# Patient Record
Sex: Male | Born: 1955 | Race: White | Hispanic: No | Marital: Married | Smoking: Former smoker
Health system: Southern US, Community
[De-identification: ages and names within clinical notes are randomized; demographics above are authoritative.]

## PROBLEM LIST (undated history)

## (undated) DIAGNOSIS — T7840XA Allergy, unspecified, initial encounter: Secondary | ICD-10-CM

## (undated) DIAGNOSIS — E785 Hyperlipidemia, unspecified: Secondary | ICD-10-CM

## (undated) DIAGNOSIS — H269 Unspecified cataract: Secondary | ICD-10-CM

## (undated) DIAGNOSIS — N200 Calculus of kidney: Secondary | ICD-10-CM

## (undated) HISTORY — DX: Allergy, unspecified, initial encounter: T78.40XA

## (undated) HISTORY — PX: NASAL SINUS SURGERY: SHX719

## (undated) HISTORY — DX: Hyperlipidemia, unspecified: E78.5

## (undated) HISTORY — PX: UPPER GASTROINTESTINAL ENDOSCOPY: SHX188

## (undated) HISTORY — PX: VASECTOMY: SHX75

## (undated) HISTORY — PX: OTHER SURGICAL HISTORY: SHX169

## (undated) HISTORY — DX: Unspecified cataract: H26.9

## (undated) HISTORY — PX: HYDROCELE EXCISION / REPAIR: SUR1145

## (undated) HISTORY — PX: CHOLECYSTECTOMY: SHX55

## (undated) HISTORY — DX: Calculus of kidney: N20.0

---

## 2007-10-27 ENCOUNTER — Ambulatory Visit: Payer: Self-pay | Admitting: Internal Medicine

## 2007-11-11 ENCOUNTER — Ambulatory Visit: Payer: Self-pay | Admitting: Internal Medicine

## 2011-08-11 NOTE — Telephone Encounter (Signed)
error 

## 2011-08-19 ENCOUNTER — Ambulatory Visit (AMBULATORY_SURGERY_CENTER): Payer: BC Managed Care – PPO

## 2011-08-19 VITALS — Ht 70.0 in | Wt 236.2 lb

## 2011-08-19 DIAGNOSIS — Z8371 Family history of colonic polyps: Secondary | ICD-10-CM

## 2011-08-19 DIAGNOSIS — Z1211 Encounter for screening for malignant neoplasm of colon: Secondary | ICD-10-CM

## 2011-08-19 DIAGNOSIS — K5900 Constipation, unspecified: Secondary | ICD-10-CM

## 2011-08-19 MED ORDER — PEG-KCL-NACL-NASULF-NA ASC-C 100 G PO SOLR: 1.0000 | Freq: Once | ORAL | Status: AC

## 2011-08-19 NOTE — Progress Notes (Signed)
Pt came into the office today for his pre-visit prior to the colonoscopy on 08/26/11 with Dr Marina Goodell. Pt states he had a colonoscopy done in 2005 with Dr. Laural Benes at [facility]. Medical release form was filled out and was given to [facility]. Ulis Rias RN

## 2011-08-26 ENCOUNTER — Ambulatory Visit (AMBULATORY_SURGERY_CENTER): Payer: BC Managed Care – PPO | Admitting: Internal Medicine

## 2011-08-26 ENCOUNTER — Encounter: Payer: Self-pay | Admitting: Internal Medicine

## 2011-08-26 VITALS — BP 151/92 | HR 72 | Temp 97.0°F | Resp 17 | Ht 70.0 in | Wt 236.0 lb

## 2011-08-26 DIAGNOSIS — Z8371 Family history of colonic polyps: Secondary | ICD-10-CM

## 2011-08-26 DIAGNOSIS — D126 Benign neoplasm of colon, unspecified: Secondary | ICD-10-CM

## 2011-08-26 DIAGNOSIS — Z1211 Encounter for screening for malignant neoplasm of colon: Secondary | ICD-10-CM

## 2011-08-26 MED ORDER — SODIUM CHLORIDE 0.9 % IV SOLN: 500.0000 mL | INTRAVENOUS | Status: DC

## 2011-08-26 NOTE — Patient Instructions (Signed)
Discharge instructions per blue and green sheets  Handouts on polyps, diverticulosis, high fiber diet, hemorrhoids  We will mail a letter in 1-2 weeks with the results and Dr Lamar Sprinkles recomendations

## 2011-08-26 NOTE — Op Note (Signed)
[facility] [address] [place], [place]  [address]  COLONOSCOPY PROCEDURE REPORT  PATIENT:  Jeffery Evans, Jeffery Evans  MR#:  [id number] BIRTHDATE:  [birth year], 55 yrs. old  GENDER:  male ENDOSCOPIST:  Wilhemina Bonito. Eda Keys, MD REF. BY:  Chilton Greathouse, M.D. PROCEDURE DATE:  08/26/2011 PROCEDURE:  Colonoscopy with snare polypectomy x 2 ASA CLASS:  Class II INDICATIONS:  Elevated Risk Screening ; index exam in [place] w/ two subcentimeter hyperplastic polyps; brother with advanced adenoma MEDICATIONS:   MAC sedation, administered by CRNA, propofol (Diprivan) 350 mg IV  DESCRIPTION OF PROCEDURE:   After the risks benefits and alternatives of the procedure were thoroughly explained, informed consent was obtained.  Digital rectal exam was performed and revealed no abnormalities.   The [id number] endoscope was introduced through the anus and advanced to the cecum, which was identified by both the appendix and ileocecal valve, without limitations.  The quality of the prep was excellent, using MoviPrep.  The instrument was then slowly withdrawn as the colon was fully examined. <<PROCEDUREIMAGES>>  FINDINGS:  Two polyps measuring 5mm and 12mm were found in the transverse colon. Polyps were snared (larger with cautery). Retrieval was successful. Scattered diverticula were found. Otherwise normal colonoscopy without other polyps, masses, vascular ectasias, or inflammatory changes.   Retroflexed views in the rectum revealed internal hemorrhoids.    The time to cecum = 2:29 minutes. The scope was then withdrawn in 20:28  minutes from the cecum and the procedure completed.  COMPLICATIONS:  None  ENDOSCOPIC IMPRESSION: 1) Two polyps in the transverse colon - removed 2) Diverticula, scattered 3) Otherwise normal colonoscopy 4) Internal hemorrhoids  RECOMMENDATIONS: 1) Follow up colonoscopy in 5 years  ______________________________ Wilhemina Bonito. Eda Keys, MD  CC:  Chilton Greathouse, MD;  The Patient  n. eSIGNED:   Wilhemina Bonito. Eda Keys at 08/26/2011 02:57 PM  Mal Amabile, [phone]

## 2011-08-26 NOTE — Progress Notes (Signed)
Patient did not experience any of the following events: a burn prior to discharge; a fall within the facility; wrong site/side/patient/procedure/implant event; or a hospital transfer or hospital admission upon discharge from the facility. (G8907) Patient did not have preoperative order for IV antibiotic SSI prophylaxis. (G8918)  

## 2011-08-27 ENCOUNTER — Telehealth: Payer: Self-pay

## 2011-08-27 NOTE — Telephone Encounter (Signed)
Left message on answering machine. 

## 2012-08-19 ENCOUNTER — Emergency Department (HOSPITAL_COMMUNITY)
Admission: EM | Admit: 2012-08-19 | Discharge: 2012-08-19 | Disposition: A | Discharge status: Home / Self Care | Payer: BC Managed Care – PPO | Attending: Emergency Medicine | Admitting: Emergency Medicine

## 2012-08-19 DIAGNOSIS — Z7982 Long term (current) use of aspirin: Secondary | ICD-10-CM | POA: Insufficient documentation

## 2012-08-19 DIAGNOSIS — Z87891 Personal history of nicotine dependence: Secondary | ICD-10-CM | POA: Insufficient documentation

## 2012-08-19 DIAGNOSIS — N200 Calculus of kidney: Secondary | ICD-10-CM | POA: Insufficient documentation

## 2012-08-19 DIAGNOSIS — Z791 Long term (current) use of non-steroidal anti-inflammatories (NSAID): Secondary | ICD-10-CM | POA: Insufficient documentation

## 2012-08-19 DIAGNOSIS — E785 Hyperlipidemia, unspecified: Secondary | ICD-10-CM | POA: Insufficient documentation

## 2012-08-19 DIAGNOSIS — Z87442 Personal history of urinary calculi: Secondary | ICD-10-CM | POA: Insufficient documentation

## 2012-08-19 DIAGNOSIS — R61 Generalized hyperhidrosis: Secondary | ICD-10-CM | POA: Insufficient documentation

## 2012-08-19 DIAGNOSIS — J309 Allergic rhinitis, unspecified: Secondary | ICD-10-CM | POA: Insufficient documentation

## 2012-08-19 DIAGNOSIS — Z79899 Other long term (current) drug therapy: Secondary | ICD-10-CM | POA: Insufficient documentation

## 2012-08-19 DIAGNOSIS — Z9889 Other specified postprocedural states: Secondary | ICD-10-CM | POA: Insufficient documentation

## 2012-08-19 DIAGNOSIS — R112 Nausea with vomiting, unspecified: Secondary | ICD-10-CM | POA: Insufficient documentation

## 2012-08-19 DIAGNOSIS — M549 Dorsalgia, unspecified: Secondary | ICD-10-CM | POA: Insufficient documentation

## 2012-08-19 LAB — URINALYSIS, ROUTINE W REFLEX MICROSCOPIC
Glucose, UA: NEGATIVE mg/dL
Ketones, ur: NEGATIVE mg/dL
Protein, ur: NEGATIVE mg/dL
pH: 5 (ref 5.0–8.0)

## 2012-08-19 LAB — URINE MICROSCOPIC-ADD ON

## 2012-08-19 MED ORDER — HYDROMORPHONE HCL PF 1 MG/ML IJ SOLN: 1.0000 mg | Freq: Once | INTRAMUSCULAR | Status: DC

## 2012-08-19 MED ORDER — HYDROMORPHONE HCL PF 1 MG/ML IJ SOLN
1.0000 mg | INTRAMUSCULAR | Status: DC | PRN
  Filled 2012-08-19: qty 1

## 2012-08-19 MED ORDER — KETOROLAC TROMETHAMINE 30 MG/ML IJ SOLN: 30.0000 mg | Freq: Once | INTRAMUSCULAR | Status: DC

## 2012-08-19 MED ORDER — NAPROXEN 500 MG PO TABS: 500.0000 mg | ORAL_TABLET | Freq: Two times a day (BID) | ORAL | Status: DC

## 2012-08-19 MED ORDER — ONDANSETRON 4 MG PO TBDP: 4.0000 mg | ORAL_TABLET | Freq: Three times a day (TID) | ORAL | Status: DC | PRN

## 2012-08-19 MED ORDER — HYDROCODONE-ACETAMINOPHEN 5-325 MG PO TABS: 2.0000 | ORAL_TABLET | ORAL | Status: DC | PRN

## 2012-08-19 MED ORDER — HYDROMORPHONE HCL PF 1 MG/ML IJ SOLN
INTRAMUSCULAR | Status: AC
  Administered 2012-08-19: 1 mg
  Filled 2012-08-19: qty 1

## 2012-08-19 MED ORDER — SODIUM CHLORIDE 0.9 % IV BOLUS (SEPSIS)
1000.0000 mL | Freq: Once | INTRAVENOUS | Status: AC
  Administered 2012-08-19: 1000 mL via INTRAVENOUS

## 2012-08-19 MED ORDER — HYDROMORPHONE HCL PF 1 MG/ML IJ SOLN
1.0000 mg | Freq: Once | INTRAMUSCULAR | Status: AC
  Administered 2012-08-19: 1 mg via INTRAVENOUS

## 2012-08-19 MED ORDER — ONDANSETRON HCL 4 MG/2ML IJ SOLN
4.0000 mg | Freq: Once | INTRAMUSCULAR | Status: AC
  Administered 2012-08-19: 4 mg via INTRAVENOUS
  Filled 2012-08-19: qty 2

## 2012-08-19 MED ORDER — TAMSULOSIN HCL 0.4 MG PO CAPS: 0.4000 mg | ORAL_CAPSULE | Freq: Two times a day (BID) | ORAL | Status: DC

## 2012-08-19 MED ORDER — KETOROLAC TROMETHAMINE 30 MG/ML IJ SOLN
INTRAMUSCULAR | Status: AC
  Administered 2012-08-19: 30 mg
  Filled 2012-08-19: qty 1

## 2012-08-19 NOTE — ED Provider Notes (Signed)
Pt not directly cared by me.  Please refer to Dr. Rondel Baton note.  Fayrene Helper, PA-C 08/19/12 [phone]

## 2012-08-19 NOTE — ED Notes (Signed)
Pt reports flank pain radating to groin

## 2012-08-19 NOTE — ED Provider Notes (Signed)
History     CSN: [id number]  Arrival date & time 08/19/12  0418   First MD Initiated Contact with Patient 08/19/12 [phone]      Chief Complaint  Patient presents with  . Flank Pain    (Consider location/radiation/quality/duration/timing/severity/associated sxs/prior treatment) HPI Comments: 56 year old male with a history of kidney stones presents with acute onset of left-sided abdominal pain radiating from the flank to the lower abdomen. He has associated nausea and diaphoresis. This is similar to his past kidney stones, comes in waves, severe at worst.  The history is provided by the patient.    Past Medical History  Diagnosis Date  . Hyperlipidemia   . Allergy   . Kidney stones     Past Surgical History  Procedure Date  . Nasal sinus surgery   . Cholecystectomy   . Upper gastrointestinal endoscopy   . Hydrocele excision / repair   . Chest tube placement     Family History  Problem Relation Age of Onset  . Colon polyps Mother   . Colon polyps Brother   . Heart disease Father     History  Substance Use Topics  . Smoking status: Former Smoker -- 7 years    Types: Cigarettes    Quit date: 08/19/1979  . Smokeless tobacco: Never Used  . Alcohol Use: No      Review of Systems  Constitutional: Negative for fever.  Gastrointestinal: Positive for nausea, vomiting and abdominal pain.  Musculoskeletal: Positive for back pain.    Allergies  Avelox  Home Medications   Current Outpatient Rx  Name  Route  Sig  Dispense  Refill  . ASPIRIN 325 MG PO TABS   Oral   Take 325 mg by mouth daily.           . FENOFIBRATE MICRONIZED 130 MG PO CAPS   Oral   Take 130 mg by mouth daily before breakfast.           . FEXOFENADINE-PSEUDOEPHED ER 180-240 MG PO TB24   Oral   Take 1 tablet by mouth daily.           Marland Kitchen ONE-DAILY MULTI VITAMINS PO TABS   Oral   Take 1 tablet by mouth daily.           Marland Kitchen OMEPRAZOLE 20 MG PO CPDR   Oral   Take 20 mg by mouth  daily.           Marland Kitchen HYDROCODONE-ACETAMINOPHEN 5-325 MG PO TABS   Oral   Take 2 tablets by mouth every 4 (four) hours as needed for pain.   30 tablet   0   . NAPROXEN 500 MG PO TABS   Oral   Take 1 tablet (500 mg total) by mouth 2 (two) times daily with a meal.   30 tablet   0   . ONDANSETRON 4 MG PO TBDP   Oral   Take 1 tablet (4 mg total) by mouth every 8 (eight) hours as needed for nausea.   10 tablet   0   . TAMSULOSIN HCL 0.4 MG PO CAPS   Oral   Take 1 capsule (0.4 mg total) by mouth 2 (two) times daily.   10 capsule   0     BP 143/80  Pulse 72  Temp 97.4 F (36.3 C) (Oral)  Resp 18  SpO2 97%  Physical Exam  Constitutional:       Uncomfortable appearing, diaphoretic  HENT:  Normocephalic, atraumatic, mucous membranes  Eyes:       No discharge, no jaundice  Abdominal: There is no tenderness.       Mild left CVA tenderness  Musculoskeletal: Normal range of motion. He exhibits no edema.  Neurological:       Alert, oriented, clear historian  Skin:       Diaphoretic, no rashes    ED Course  Procedures (including critical care time)  Labs Reviewed  URINALYSIS, ROUTINE W REFLEX MICROSCOPIC - Abnormal; Notable for the following:    Color, Urine AMBER (*)  BIOCHEMICALS MAY BE AFFECTED BY COLOR   APPearance CLOUDY (*)     Specific Gravity, Urine 1.033 (*)     Hgb urine dipstick LARGE (*)     Bilirubin Urine SMALL (*)     Leukocytes, UA TRACE (*)     All other components within normal limits  URINE MICROSCOPIC-ADD ON - Abnormal; Notable for the following:    Bacteria, UA FEW (*)     Casts HYALINE CASTS (*)     All other components within normal limits  URINE CULTURE   No results found.   1. Kidney stone on left side       MDM  The patient is ill-appearing with a likely kidney stone, pain medications ordered, fluids ordered, nausea medicines ordered.   Pain meds given with signficant improvement, VS stable, pt likely has left sided  ureteral colic - declines CT scan, meds for home, Urology f/u as needed.  Discharge Prescriptions include:  Naprosyn Hydrocodone Flomax Zofran       Vida Roller, MD 08/19/12 [phone]

## 2012-08-19 NOTE — ED Provider Notes (Signed)
  Medical screening examination/treatment/procedure(s) were conducted as a shared visit with non-physician practitioner(s) and myself.  I personally evaluated the patient during the encounter  Please see my separate respective documentation pertaining to this patient encounter   Vida Roller, MD 08/19/12 [phone]

## 2012-08-20 LAB — URINE CULTURE
Colony Count: NO GROWTH
Culture: NO GROWTH

## 2014-05-17 ENCOUNTER — Other Ambulatory Visit: Payer: Self-pay

## 2014-05-17 DIAGNOSIS — K625 Hemorrhage of anus and rectum: Secondary | ICD-10-CM

## 2014-05-18 ENCOUNTER — Other Ambulatory Visit: Payer: Self-pay

## 2014-05-18 MED ORDER — PEG-KCL-NACL-NASULF-NA ASC-C 100 G PO SOLR: 1.0000 | Freq: Once | ORAL | Status: DC

## 2014-05-24 ENCOUNTER — Encounter: Payer: BC Managed Care – PPO | Admitting: Internal Medicine

## 2014-06-06 ENCOUNTER — Encounter: Payer: BC Managed Care – PPO | Admitting: Internal Medicine

## 2014-06-06 ENCOUNTER — Ambulatory Visit (AMBULATORY_SURGERY_CENTER): Payer: BC Managed Care – PPO | Admitting: Internal Medicine

## 2014-06-06 ENCOUNTER — Encounter: Payer: Self-pay | Admitting: Internal Medicine

## 2014-06-06 VITALS — BP 123/78 | HR 57 | Temp 96.4°F | Resp 29 | Ht 70.0 in | Wt 236.0 lb

## 2014-06-06 DIAGNOSIS — Z86010 Personal history of colonic polyps: Secondary | ICD-10-CM

## 2014-06-06 MED ORDER — SODIUM CHLORIDE 0.9 % IV SOLN: 500.0000 mL | INTRAVENOUS | Status: DC

## 2014-06-06 NOTE — Patient Instructions (Signed)
YOU HAD AN ENDOSCOPIC PROCEDURE TODAY AT THE [facility]: Refer to the procedure report that was given to you for any specific questions about what was found during the examination.  If the procedure report does not answer your questions, please call your gastroenterologist to clarify.  If you requested that your care partner not be given the details of your procedure findings, then the procedure report has been included in a sealed envelope for you to review at your convenience later.  YOU SHOULD EXPECT: Some feelings of bloating in the abdomen. Passage of more gas than usual.  Walking can help get rid of the air that was put into your GI tract during the procedure and reduce the bloating. If you had a lower endoscopy (such as a colonoscopy or flexible sigmoidoscopy) you may notice spotting of blood in your stool or on the toilet paper. If you underwent a bowel prep for your procedure, then you may not have a normal bowel movement for a few days.  DIET: Your first meal following the procedure should be a light meal and then it is ok to progress to your normal diet.  A half-sandwich or bowl of soup is an example of a good first meal.  Heavy or fried foods are harder to digest and may make you feel nauseous or bloated.  Likewise meals heavy in dairy and vegetables can cause extra gas to form and this can also increase the bloating.  Drink plenty of fluids but you should avoid alcoholic beverages for 24 hours.  ACTIVITY: Your care partner should take you home directly after the procedure.  You should plan to take it easy, moving slowly for the rest of the day.  You can resume normal activity the day after the procedure however you should NOT DRIVE or use heavy machinery for 24 hours (because of the sedation medicines used during the test).    SYMPTOMS TO REPORT IMMEDIATELY: A gastroenterologist can be reached at any hour.  During normal business hours, 8:30 AM to 5:00 PM Monday through Friday,  call [phone].  After hours and on weekends, please call the GI answering service at [phone] who will take a message and have the physician on call contact you.   Following lower endoscopy (colonoscopy or flexible sigmoidoscopy):  Excessive amounts of blood in the stool  Significant tenderness or worsening of abdominal pains  Swelling of the abdomen that is new, acute  Fever of 100F or higher   FOLLOW UP: If any biopsies were taken you will be contacted by phone or by letter within the next 1-3 weeks.  Call your gastroenterologist if you have not heard about the biopsies in 3 weeks.  Our staff will call the home number listed on your records the next business day following your procedure to check on you and address any questions or concerns that you may have at that time regarding the information given to you following your procedure. This is a courtesy call and so if there is no answer at the home number and we have not heard from you through the emergency physician on call, we will assume that you have returned to your regular daily activities without incident.  SIGNATURES/CONFIDENTIALITY: You and/or your care partner have signed paperwork which will be entered into your electronic medical record.  These signatures attest to the fact that that the information above on your After Visit Summary has been reviewed and is understood.  Full responsibility of the confidentiality of   this discharge information lies with you and/or your care-partner.     Handouts were given to your care partner on  diverticulosis, a high fiber diet with liberal fluid intake and hemorrhoids. You may resume your current medications today. Please call if any questions or concerns.   

## 2014-06-06 NOTE — Progress Notes (Signed)
Report to PACU, RN, vss, BBS= Clear.  

## 2014-06-06 NOTE — Progress Notes (Signed)
No problems noted in the recovery room. maw 

## 2014-06-06 NOTE — Op Note (Signed)
[place]  [address] [place], [address]   COLONOSCOPY PROCEDURE REPORT  PATIENT: Jeffery Evans, Jeffery Evans  MR#: [id number] BIRTHDATE: 04-22-1956 , 56  yrs. old GENDER: male ENDOSCOPIST: Eustace Quail, MD REFERRED [id number] Program Recall PROCEDURE DATE:  06/06/2014 PROCEDURE:   Colonoscopy, surveillance First Screening Colonoscopy - Avg.  risk and is 50 yrs.  old or older - No.  Prior Negative Screening - Now for repeat screening. N/A  History of Adenoma - Now for follow-up colonoscopy & has been > or = to 3 yrs.  Yes hx of adenoma.  Has been 3 or more years since last colonoscopy.  Polyps Removed Today? No.  Recommend repeat exam, <10 yrs? Yes.  Polyps Removed Today? No.  Recommend repeat exam, <10 yrs? Yes.  High risk (family or personal hx). ASA CLASS:   Class I INDICATIONS:surveillance colonoscopy based on a history of adenomatous colonic polyp(s). Sibling with history of advanced adenoma. Index exam 2005 in [place] subcentimeter hyperplastic polyps. 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 MEDICATIONS: Monitored anesthesia care and Propofol 260 mg IV  DESCRIPTION OF PROCEDURE:   After the risks benefits and alternatives of the procedure were thoroughly explained, informed consent was obtained.  The digital rectal exam revealed no abnormalities of the rectum.   The LB [id number]  endoscope was introduced through the anus and advanced to the cecum, which was identified by both the appendix and ileocecal valve. No adverse events experienced.   The quality of the prep was excellent, using MoviPrep  The instrument was then slowly withdrawn as the colon was fully examined.  COLON FINDINGS: There was moderate diverticulosis noted in the sigmoid colon.   The colon mucosa was otherwise normal. No polyps identified.  Retroflexed views revealed internal hemorrhoids. The time to cecum=1 minutes 43 seconds.  Withdrawal time=11 minutes 13 seconds.   The scope was withdrawn and the procedure completed. COMPLICATIONS: There were no immediate complications.  ENDOSCOPIC IMPRESSION: 1.   Moderate diverticulosis was noted in the sigmoid colon 2.   The colon mucosa was otherwise normal 3.  Internal hemorrhoids  RECOMMENDATIONS: 1. Follow up colonoscopy in 5 years (advanced adenoma on previous exam)  eSigned:  Eustace Quail, MD 06/06/2014 1:24 PM   cc: Prince Solian, MD and The Patient

## 2014-06-07 ENCOUNTER — Telehealth: Payer: Self-pay

## 2014-06-07 NOTE — Telephone Encounter (Signed)
de Follow up Call-  Call back number 06/06/2014  Post procedure Call Back phone  # [phone]  Permission to leave phone message Yes     Patient questions:  Do you have a fever, pain , or abdominal swelling? No. Pain Score  0 *  Have you tolerated food without any problems? Yes.    Have you been able to return to your normal activities? Yes.    Do you have any questions about your discharge instructions: Diet   No. Medications  No. Follow up visit  No.  Do you have questions or concerns about your Care? No.  Actions: * If pain score is 4 or above: No action needed, pain <4.

## 2016-07-07 DIAGNOSIS — H6090 Unspecified otitis externa, unspecified ear: Secondary | ICD-10-CM | POA: Insufficient documentation

## 2016-07-11 ENCOUNTER — Encounter: Payer: Self-pay | Admitting: Internal Medicine

## 2018-03-03 DIAGNOSIS — L989 Disorder of the skin and subcutaneous tissue, unspecified: Secondary | ICD-10-CM | POA: Insufficient documentation

## 2019-05-16 ENCOUNTER — Encounter: Payer: Self-pay | Admitting: Internal Medicine

## 2019-06-08 ENCOUNTER — Encounter: Payer: Self-pay | Admitting: Internal Medicine

## 2019-06-13 ENCOUNTER — Ambulatory Visit (AMBULATORY_SURGERY_CENTER): Payer: Self-pay | Admitting: *Deleted

## 2019-06-13 ENCOUNTER — Other Ambulatory Visit: Payer: Self-pay

## 2019-06-13 VITALS — Temp 98.0°F | Ht 71.0 in | Wt 254.8 lb

## 2019-06-13 DIAGNOSIS — Z86010 Personal history of colonic polyps: Secondary | ICD-10-CM

## 2019-06-13 MED ORDER — NA SULFATE-K SULFATE-MG SULF 17.5-3.13-1.6 GM/177ML PO SOLN: 1.0000 | Freq: Once | ORAL | 0 refills | Status: AC

## 2019-06-13 NOTE — Progress Notes (Signed)

## 2019-07-01 ENCOUNTER — Encounter: Payer: Self-pay | Admitting: Internal Medicine

## 2019-07-15 ENCOUNTER — Ambulatory Visit (AMBULATORY_SURGERY_CENTER): Payer: BC Managed Care – PPO | Admitting: Internal Medicine

## 2019-07-15 ENCOUNTER — Other Ambulatory Visit: Payer: Self-pay

## 2019-07-15 ENCOUNTER — Encounter: Payer: Self-pay | Admitting: Internal Medicine

## 2019-07-15 VITALS — BP 134/72 | HR 68 | Temp 98.1°F | Resp 21 | Ht 71.0 in | Wt 254.0 lb

## 2019-07-15 DIAGNOSIS — Z86010 Personal history of colonic polyps: Secondary | ICD-10-CM

## 2019-07-15 DIAGNOSIS — D122 Benign neoplasm of ascending colon: Secondary | ICD-10-CM | POA: Diagnosis not present

## 2019-07-15 DIAGNOSIS — Z1211 Encounter for screening for malignant neoplasm of colon: Secondary | ICD-10-CM | POA: Diagnosis not present

## 2019-07-15 MED ORDER — SODIUM CHLORIDE 0.9 % IV SOLN: 500.0000 mL | INTRAVENOUS | Status: DC

## 2019-07-15 NOTE — Progress Notes (Signed)
Temp JB  V/S CW  I have reviewed the patient's medical history in detail and updated the computerized patient record. 

## 2019-07-15 NOTE — Progress Notes (Signed)
A/ox3, pleased with MAC, report to RN 

## 2019-07-15 NOTE — Progress Notes (Signed)
Called to room to assist during endoscopic procedure.  Patient ID and intended procedure confirmed with present staff. Received instructions for my participation in the procedure from the performing physician.  

## 2019-07-15 NOTE — Patient Instructions (Signed)
Handouts given: Polyps, Diverticulosis and Hemorrhoids   YOU HAD AN ENDOSCOPIC PROCEDURE TODAY AT THE [facility]:   Refer to the procedure report that was given to you for any specific questions about what was found during the examination.  If the procedure report does not answer your questions, please call your gastroenterologist to clarify.  If you requested that your care partner not be given the details of your procedure findings, then the procedure report has been included in a sealed envelope for you to review at your convenience later.  YOU SHOULD EXPECT: Some feelings of bloating in the abdomen. Passage of more gas than usual.  Walking can help get rid of the air that was put into your GI tract during the procedure and reduce the bloating. If you had a lower endoscopy (such as a colonoscopy or flexible sigmoidoscopy) you may notice spotting of blood in your stool or on the toilet paper. If you underwent a bowel prep for your procedure, you may not have a normal bowel movement for a few days.  Please Note:  You might notice some irritation and congestion in your nose or some drainage.  This is from the oxygen used during your procedure.  There is no need for concern and it should clear up in a day or so.  SYMPTOMS TO REPORT IMMEDIATELY:   Following lower endoscopy (colonoscopy or flexible sigmoidoscopy):  Excessive amounts of blood in the stool  Significant tenderness or worsening of abdominal pains  Swelling of the abdomen that is new, acute  Fever of 100F or higher   For urgent or emergent issues, a gastroenterologist can be reached at any hour by calling [phone].   DIET:  We do recommend a small meal at first, but then you may proceed to your regular diet.  Drink plenty of fluids but you should avoid alcoholic beverages for 24 hours.  ACTIVITY:  You should plan to take it easy for the rest of today and you should NOT DRIVE or use heavy machinery until tomorrow  (because of the sedation medicines used during the test).    FOLLOW UP: Our staff will call the number listed on your records 48-72 hours following your procedure to check on you and address any questions or concerns that you may have regarding the information given to you following your procedure. If we do not reach you, we will leave a message.  We will attempt to reach you two times.  During this call, we will ask if you have developed any symptoms of COVID 19. If you develop any symptoms (ie: fever, flu-like symptoms, shortness of breath, cough etc.) before then, please call [phone].  If you test positive for Covid 19 in the 2 weeks post procedure, please call and report this information to us.    If any biopsies were taken you will be contacted by phone or by letter within the next 1-3 weeks.  Please call us at [phone] if you have not heard about the biopsies in 3 weeks.    SIGNATURES/CONFIDENTIALITY: You and/or your care partner have signed paperwork which will be entered into your electronic medical record.  These signatures attest to the fact that that the information above on your After Visit Summary has been reviewed and is understood.  Full responsibility of the confidentiality of this discharge information lies with you and/or your care-partner. 

## 2019-07-15 NOTE — Op Note (Signed)
[place] Patient Name: Jeffery Evans Procedure Date: 07/15/2019 10:52 AM MRN: [id number] Endoscopist: Docia Chuck. Henrene Pastor , MD Age: 63 Referring MD:  Date of Birth: 1956/01/14 Gender: Male Account #: [id number] Procedure:                Colonoscopy with cold snare polypectomy x 1 Indications:              High risk colon cancer surveillance: Personal                            history of adenoma with villous component Medicines:                Monitored Anesthesia Care Procedure:                Pre-Anesthesia Assessment:                           - Prior to the procedure, a History and Physical                            was performed, and patient medications and                            allergies were reviewed. The patient's tolerance of                            previous anesthesia was also reviewed. The risks                            and benefits of the procedure and the sedation                            options and risks were discussed with the patient.                            All questions were answered, and informed consent                            was obtained. Prior Anticoagulants: The patient has                            taken no previous anticoagulant or antiplatelet                            agents. ASA Grade Assessment: II - A patient with                            mild systemic disease. After reviewing the risks                            and benefits, the patient was deemed in                            satisfactory condition to undergo the procedure.  After obtaining informed consent, the colonoscope                            was passed under direct vision. Throughout the                            procedure, the patient's blood pressure, pulse, and                            oxygen saturations were monitored continuously. The                            Colonoscope was introduced through the anus and        advanced to the the cecum, identified by                            appendiceal orifice and ileocecal valve. The                            ileocecal valve, appendiceal orifice, and rectum                            were photographed. The quality of the bowel                            preparation was excellent. The colonoscopy was                            performed without difficulty. The patient tolerated                            the procedure well. The bowel preparation used was                            SUPREP via split dose instruction. Scope In: 11:02:11 AM Scope Out: 11:19:54 AM Scope Withdrawal Time: 0 hours 16 minutes 9 seconds  Total Procedure Duration: 0 hours 17 minutes 43 seconds  Findings:                 A 3 mm polyp was found in the ascending colon. The                            polyp was removed with a cold snare. Resection and                            retrieval were complete.                           Multiple diverticula were found in the left colon.                           Internal hemorrhoids were found during retroflexion.  The exam was otherwise without abnormality on                            direct and retroflexion views. Complications:            No immediate complications. Estimated blood loss:                            None. Estimated Blood Loss:     Estimated blood loss: none. Impression:               - One 3 mm polyp in the ascending colon, removed                            with a cold snare. Resected and retrieved.                           - Diverticulosis in the left colon.                           - Internal hemorrhoids.                           - The examination was otherwise normal on direct                            and retroflexion views. Recommendation:           - Repeat colonoscopy in 5 years for surveillance                            (personal history of advanced adenoma).                            - Patient has a contact number available for                            emergencies. The signs and symptoms of potential                            delayed complications were discussed with the                            patient. Return to normal activities tomorrow.                            Written discharge instructions were provided to the                            patient.                           - Resume previous diet.                           - Continue present medications.                           -  Await pathology results. Docia Chuck. Henrene Pastor, MD 07/15/2019 11:23:47 AM This report has been signed electronically.

## 2019-07-19 ENCOUNTER — Telehealth: Payer: Self-pay

## 2019-07-19 NOTE — Telephone Encounter (Signed)
  Follow up Call-  Call back number 07/15/2019  Post procedure Call Back phone  # [phone]  Permission to leave phone message Yes  Some recent data might be hidden     Patient questions:  Do you have a fever, pain , or abdominal swelling? No. Pain Score  0 *  Have you tolerated food without any problems? Yes.    Have you been able to return to your normal activities? Yes.    Do you have any questions about your discharge instructions: Diet   No. Medications  No. Follow up visit  No.  Do you have questions or concerns about your Care? No.  Actions: * If pain score is 4 or above: No action needed, pain <4. 1. Have you developed a fever since your procedure? no  2.   Have you had an respiratory symptoms (SOB or cough) since your procedure? no  3.   Have you tested positive for COVID 19 since your procedure no  4.   Have you had any family members/close contacts diagnosed with the COVID 19 since your procedure?  no   If yes to any of these questions please route to Joylene John, RN and Alphonsa Gin, Therapist, sports.

## 2019-07-20 ENCOUNTER — Encounter: Payer: Self-pay | Admitting: Internal Medicine

## 2020-12-07 ENCOUNTER — Other Ambulatory Visit (HOSPITAL_COMMUNITY): Payer: Self-pay | Admitting: *Deleted

## 2020-12-13 ENCOUNTER — Encounter (HOSPITAL_COMMUNITY): Payer: Self-pay | Admitting: Anesthesiology

## 2020-12-14 ENCOUNTER — Ambulatory Visit (HOSPITAL_BASED_OUTPATIENT_CLINIC_OR_DEPARTMENT_OTHER)
Admission: RE | Admit: 2020-12-14 | Discharge: 2020-12-14 | Disposition: A | Discharge status: Home / Self Care | Payer: BC Managed Care – PPO | Source: Ambulatory Visit | Attending: Cardiology | Admitting: Cardiology

## 2020-12-14 ENCOUNTER — Other Ambulatory Visit: Payer: Self-pay

## 2021-01-23 ENCOUNTER — Telehealth: Payer: Self-pay

## 2021-01-23 NOTE — Telephone Encounter (Signed)
Per Dr. Burt Knack, called patient to arrange NP visit for elevated calcium score.  Left message to call back.

## 2021-01-23 NOTE — Telephone Encounter (Signed)
Scheduled patient for NP evaluation with Dr. Burt Knack 01/29/21.  He was grateful for call and agrees with plan.

## 2021-01-29 ENCOUNTER — Encounter: Payer: Self-pay | Admitting: Cardiovascular Disease

## 2021-01-29 ENCOUNTER — Other Ambulatory Visit: Payer: Self-pay

## 2021-01-29 ENCOUNTER — Ambulatory Visit (INDEPENDENT_AMBULATORY_CARE_PROVIDER_SITE_OTHER): Payer: BC Managed Care – PPO | Admitting: Cardiovascular Disease

## 2021-01-29 VITALS — BP 136/80 | HR 70 | Ht 71.0 in | Wt 239.6 lb

## 2021-01-29 DIAGNOSIS — R931 Abnormal findings on diagnostic imaging of heart and coronary circulation: Secondary | ICD-10-CM

## 2021-01-29 DIAGNOSIS — E782 Mixed hyperlipidemia: Secondary | ICD-10-CM | POA: Diagnosis not present

## 2021-01-29 MED ORDER — ASPIRIN EC 81 MG PO TBEC: 81.0000 mg | DELAYED_RELEASE_TABLET | Freq: Every day | ORAL | 3 refills | Status: AC

## 2021-01-29 NOTE — Progress Notes (Signed)
Cardiology Office Note:    Date:  01/30/2021   ID:  Jeffery Evans, DOB 01-03-1956, MRN [id number]  PCP:  Prince Solian, MD   [facility] Providers Cardiologist:  Sherren Mocha, MD     Referring MD: Prince Solian, MD   Chief Complaint  Patient presents with  . Coronary Artery Disease    History of Present Illness:    Jeffery Evans is a 65 y.o. male presenting for evaluation of elevated coronary calcium score.   The patient is here alone today. He underwent Doctor's Day coronary calcium screening and had an elevated calcium score, prompting cardiac evaluation today. He is physically active and feels well. He is able to do very hard work outdoors with no exertional chest pain or pressure. Today, he denies symptoms of palpitations, chest pain, orthopnea, PND, lower extremity edema, dizziness, or syncope.  The patient is able to do hard work and has mild shortness of breath at times, but does not feel like any of this is out of the norm.  States that he has felt Mrs. H-related.  The patient brings in copy of labs from December 12, 2020.  This demonstrates a creatinine of 1.09, glucose of 108, BUN 15, potassium 4.7, AST 30, ALT 43, total cholesterol 200, triglycerides 147, HDL 32, LDL 141, TSH 2.3, hemoglobin 15.8, platelet count 248,000, hemoglobin A1c 5.7.  He has no personal history of hypertension. He does have hyperlipidemia but hasn't been able to tolerate any of the statins. He has tried simvastatin, atorvastatin, pravastatin, rosuvastatin. He wasn't able to tolerate any of the statins because of myalgias and 'brain fog.' He had GI problems with fenofibrate. He hasn't tried Zetia to date.   Past Medical History:  Diagnosis Date  . Allergy   . Cataract    BEGINNING BILATERAL  . Hyperlipidemia   . Kidney stones     Past Surgical History:  Procedure Laterality Date  . chest tube placement    . CHOLECYSTECTOMY    . HYDROCELE EXCISION / REPAIR    . NASAL SINUS SURGERY     . UPPER GASTROINTESTINAL ENDOSCOPY    . VASECTOMY      Current Medications: Current Meds  Medication Sig  . aspirin EC 81 MG tablet Take 1 tablet (81 mg total) by mouth daily. Swallow whole.  . Cholecalciferol (VITAMIN D3 PO) Take 5,000 Units by mouth daily.  . Coenzyme Q10 300 MG CAPS Take by mouth daily.  . fluticasone (FLONASE) 50 MCG/ACT nasal spray Place into the nose.  . Multiple Vitamin (MULTIVITAMIN) tablet Take 1 tablet by mouth daily.  . Multiple Vitamins-Minerals (PRESERVISION AREDS 2) CAPS Take by mouth.  Marland Kitchen omeprazole (PRILOSEC) 20 MG capsule Take 20 mg by mouth as needed (for heartburn).  . [DISCONTINUED] aspirin 325 MG tablet Take 325 mg by mouth daily.     Allergies:   Avelox [moxifloxacin hcl in nacl]   Social History   Socioeconomic History  . Marital status: Married    Spouse name: Not on file  . Number of children: Not on file  . Years of education: Not on file  . Highest education level: Not on file  Occupational History  . Not on file  Tobacco Use  . Smoking status: Former Smoker    Years: 7.00    Types: Cigarettes    Quit date: 08/19/1979    Years since quitting: 41.4  . Smokeless tobacco: Never Used  Vaping Use  . Vaping Use: Never used  Substance and Sexual Activity  .  Alcohol use: No  . Drug use: No  . Sexual activity: Not on file  Other Topics Concern  . Not on file  Social History Narrative  . Not on file   Social Determinants of Health   Financial Resource Strain: Not on file  Food Insecurity: Not on file  Transportation Needs: Not on file  Physical Activity: Not on file  Stress: Not on file  Social Connections: Not on file     Family History: The patient's family history includes Colon cancer in his maternal uncle; Colon polyps in his brother and mother; Heart disease in his father. There is no history of Esophageal cancer, Rectal cancer, or Stomach cancer. Mother - CAD, alive at 90 years old, hx of coronary stent, subclavian  stent, renal artery stent Father - CAD, atrial fibrillation, died of a stroke at 66 Younger Brother: RCA stent  ROS:   Please see the history of present illness.    All other systems reviewed and are negative.  EKGs/Labs/Other Studies Reviewed:    The following studies were reviewed today: CT Coronary Calcium Score: IMPRESSION: 1. Coronary calcium score of 199. This was 69th percentile for age, gender, and race matched controls.  2. Ascending Aorta: 39 mm on non contrasted study- this represents the upper limit of normal for age; unable to correct for body in this study.  3. Mitral Valve: Mild Mitral annular calcification noted.   EKG:  EKG is ordered today.  The ekg ordered today demonstrates NSR, LAD, poor r wave progression, no significant ST-T changes  Recent Labs: No results found for requested labs within last 8760 hours.  Recent Lipid Panel No results found for: CHOL, TRIG, HDL, CHOLHDL, VLDL, LDLCALC, LDLDIRECT   Risk Assessment/Calculations:       Physical Exam:    VS:  BP 136/80   Pulse 70   Ht 5\' 11"  (1.803 m)   Wt 239 lb 9.6 oz (108.7 kg)   SpO2 98%   BMI 33.42 kg/m     Wt Readings from Last 3 Encounters:  01/29/21 239 lb 9.6 oz (108.7 kg)  07/15/19 254 lb (115.2 kg)  06/13/19 254 lb 12.8 oz (115.6 kg)     GEN:  Well nourished, well developed in no acute distress HEENT: Normal NECK: No JVD; No carotid bruits LYMPHATICS: No lymphadenopathy CARDIAC: RRR, no murmurs, rubs, gallops RESPIRATORY:  Clear to auscultation without rales, wheezing or rhonchi  ABDOMEN: Soft, non-tender, non-distended MUSCULOSKELETAL:  No edema; No deformity  SKIN: Warm and dry NEUROLOGIC:  Alert and oriented x 3 PSYCHIATRIC:  Normal affect   ASSESSMENT:    1. Elevated coronary artery calcium score   2. Mixed hyperlipidemia    PLAN:    In order of problems listed above:  1. Patient with elevated coronary calcium score, untreated hyperlipidemia due to statin  intolerance, and family history of coronary artery disease in his younger brother who required PCI.  I have recommended an exercise Myoview stress test for further risk stratification.  Advised the patient to reduce his aspirin dose to 81 mg daily.  See below for discussion of lipids. 2. Lipids as outlined above.  LDL cholesterol above goal.  Intolerant to multiple statin agents with previous trials of rosuvastatin, atorvastatin, simvastatin, and pravastatin.  Unable to tolerate any of these medicines.  I have recommended referral to the lipid clinic for consideration of a PCSK9 inhibitor.  Lifestyle modification discussed.   Shared Decision Making/Informed Consent The risks [chest pain, shortness of breath, cardiac  arrhythmias, dizziness, blood pressure fluctuations, myocardial infarction, stroke/transient ischemic attack, nausea, vomiting, allergic reaction, radiation exposure, metallic taste sensation and life-threatening complications (estimated to be 1 in 10,000)], benefits (risk stratification, diagnosing coronary artery disease, treatment guidance) and alternatives of a nuclear stress test were discussed in detail with Mr. Elliff and he agrees to proceed.       Medication Adjustments/Labs and Tests Ordered: Current medicines are reviewed at length with the patient today.  Concerns regarding medicines are outlined above.  Orders Placed This Encounter  Procedures  . AMB Referral to Heartcare Pharm-D  . MYOCARDIAL PERFUSION IMAGING  . EKG 12-Lead   Meds ordered this encounter  Medications  . aspirin EC 81 MG tablet    Sig: Take 1 tablet (81 mg total) by mouth daily. Swallow whole.    Dispense:  90 tablet    Refill:  3    Patient Instructions  Medication Instructions:  1) DECREASE ASPIRIN to 81 mg daily  *If you need a refill on your cardiac medications before your next appointment, please call your pharmacy*  Testing/Procedures: Dr. Burt Knack recommends you have a NUCLEAR STRESS  TEST.  Follow-Up: You have been referred to Lipid Clinic.  At [facility], you and your health needs are our priority.  As part of our continuing mission to provide you with exceptional heart care, we have created designated Provider Care Teams.  These Care Teams include your primary Cardiologist (physician) and Advanced Practice Providers (APPs -  Physician Assistants and Nurse Practitioners) who all work together to provide you with the care you need, when you need it. Your next appointment:   12 month(s) The format for your next appointment:   In Person Provider:   You may see Dr. Burt Knack or one of the following Advanced Practice Providers on your designated Care Team:    Richardson Dopp, PA-C  Robbie Lis, [place]      Signed, Sherren Mocha, MD  01/30/2021 6:23 AM    [place]

## 2021-01-29 NOTE — Patient Instructions (Addendum)
Medication Instructions:  1) DECREASE ASPIRIN to 81 mg daily  *If you need a refill on your cardiac medications before your next appointment, please call your pharmacy*  Testing/Procedures: Dr. Burt Knack recommends you have a NUCLEAR STRESS TEST.  Follow-Up: You have been referred to Lipid Clinic.  At [facility], you and your health needs are our priority.  As part of our continuing mission to provide you with exceptional heart care, we have created designated Provider Care Teams.  These Care Teams include your primary Cardiologist (physician) and Advanced Practice Providers (APPs -  Physician Assistants and Nurse Practitioners) who all work together to provide you with the care you need, when you need it. Your next appointment:   12 month(s) The format for your next appointment:   In Person Provider:   You may see Dr. Burt Knack or one of the following Advanced Practice Providers on your designated Care Team:    Richardson Dopp, PA-C  [place], [place]

## 2021-01-31 ENCOUNTER — Encounter: Payer: Self-pay | Admitting: Cardiovascular Disease

## 2021-02-07 ENCOUNTER — Telehealth (HOSPITAL_COMMUNITY): Payer: Self-pay | Admitting: *Deleted

## 2021-02-07 NOTE — Telephone Encounter (Signed)
Patient given detailed instructions per Myocardial Perfusion Study Information Sheet for the test on 02/14/21. Patient notified to arrive 15 minutes early and that it is imperative to arrive on time for appointment to keep from having the test rescheduled.  If you need to cancel or reschedule your appointment, please call the office within 24 hours of your appointment. . Patient verbalized understanding. Kirstie Peri

## 2021-02-14 ENCOUNTER — Ambulatory Visit (HOSPITAL_COMMUNITY): Payer: BC Managed Care – PPO | Attending: Internal Medicine

## 2021-02-14 ENCOUNTER — Other Ambulatory Visit: Payer: Self-pay

## 2021-02-14 DIAGNOSIS — R931 Abnormal findings on diagnostic imaging of heart and coronary circulation: Secondary | ICD-10-CM | POA: Insufficient documentation

## 2021-02-14 LAB — MYOCARDIAL PERFUSION IMAGING
Estimated workload: 8 METS
Exercise duration (min): 6 min
Exercise duration (sec): 40 s
LV dias vol: 88 mL (ref 62–150)
LV sys vol: 35 mL
MPHR: 156 {beats}/min
Peak HR: 142 {beats}/min
Percent HR: 91 %
Rest HR: 63 {beats}/min
SDS: 1
SRS: 0
SSS: 1
TID: 1.05

## 2021-02-14 MED ORDER — TECHNETIUM TC 99M TETROFOSMIN IV KIT
10.1000 | PACK | Freq: Once | INTRAVENOUS | Status: AC | PRN
  Administered 2021-02-14: 10.1 via INTRAVENOUS
  Filled 2021-02-14: qty 11

## 2021-02-14 MED ORDER — TECHNETIUM TC 99M TETROFOSMIN IV KIT
31.5000 | PACK | Freq: Once | INTRAVENOUS | Status: AC | PRN
  Administered 2021-02-14: 31.5 via INTRAVENOUS
  Filled 2021-02-14: qty 32

## 2021-02-15 ENCOUNTER — Ambulatory Visit (INDEPENDENT_AMBULATORY_CARE_PROVIDER_SITE_OTHER): Payer: BC Managed Care – PPO | Admitting: Pharmacist

## 2021-02-15 ENCOUNTER — Encounter: Payer: Self-pay | Admitting: Pharmacist

## 2021-02-15 DIAGNOSIS — T466X5A Adverse effect of antihyperlipidemic and antiarteriosclerotic drugs, initial encounter: Secondary | ICD-10-CM

## 2021-02-15 DIAGNOSIS — M791 Myalgia, unspecified site: Secondary | ICD-10-CM | POA: Insufficient documentation

## 2021-02-15 DIAGNOSIS — E782 Mixed hyperlipidemia: Secondary | ICD-10-CM | POA: Diagnosis not present

## 2021-02-15 NOTE — Patient Instructions (Addendum)
It was nice meeting you today  We would like your LDL to be less than 70  Continue healthy eating and physical activity  We will start a new medication called Repatha which you will inject once every 2 weeks  We will check your lipid panel in about 2-3 months  I will call you when the prior authorization is approved  Karren Cobble, PharmD, BCACP, [place], [place]. [address] [place], [place] [address] Phone: [phone]; Fax: [phone] 02/15/2021 3:42 PM

## 2021-02-15 NOTE — Progress Notes (Signed)
Patient ID: Jeffery Evans                 DOB: Nov 08, 1955                    MRN: [id number]     HPI: Jeffery Evans is a 65 y.o. male patient referred to lipid clinic by Dr Burt Knack. PMH is significant for HLD and statin intolerance.  Patient has tried multiple statins in the past and all caused myalgias and impaired thinking.  Patient presents today in good spirits.  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 Recent coronary CT showed score of 199, which was 69th percentile.  Has a strong family history of CAD.  Reports both parents had elevated cholesterol and younger brother had an MI.  Brother also had same adverse effects to statins that he had.  Is very physically active.  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    Is willing to start PCSK9i.  Current Medications: n/a Intolerances: simvastatin, pravastatin, atorvastatin, rposuastatin, fenofibrate Risk Factors: family history, elevated coronary calcium score LDL goal: <70  Labs: TC 200, Trigs 147, HDL 32, LDL 141 (12/12/20 - not on any meds)  Coronary calcium score of 199. This was 69th percentile for age, gender, and race matched controls.  Past Medical History:  Diagnosis Date   Allergy    Cataract    BEGINNING BILATERAL   Hyperlipidemia    Kidney stones     Current Outpatient Medications on File Prior to Visit  Medication Sig Dispense Refill   aspirin EC 81 MG tablet Take 1 tablet (81 mg total) by mouth daily. Swallow whole. 90 tablet 3   Cholecalciferol (VITAMIN D3 PO) Take 5,000 Units by mouth daily.     Coenzyme Q10 300 MG CAPS Take by mouth daily.     fluticasone (FLONASE) 50 MCG/ACT nasal spray Place into the nose.     Multiple Vitamin (MULTIVITAMIN) tablet Take 1 tablet by mouth daily.     Multiple Vitamins-Minerals (PRESERVISION AREDS 2) CAPS Take by mouth.     omeprazole (PRILOSEC) 20 MG capsule Take 20 mg by mouth as needed (for heartburn).     No current  facility-administered medications on file prior to visit.    Allergies  Allergen Reactions   Avelox [Moxifloxacin Hcl In Nacl] Anaphylaxis    Assessment/Plan:  1. Hyperlipidemia - Patient LDL 141 which is above goal of less than 70 despite physical activity and healthy eating.  Likely due to statin intolerance and family history.  Checked patient formulary and plan prefers Repatha.  Using demo pen, educated patient on storage, site selection, and administration.  Patietn was able to demonstrate use of pen in room.  Will complete PA and call patient when approved.  Repeat lipid panel set for mid August.  Karren Cobble, PharmD, BCACP, [place], [place] [address] [place], [place] [address] Phone: [phone]; Fax: [phone] 02/15/2021 4:05 PM

## 2021-02-21 ENCOUNTER — Telehealth: Payer: Self-pay | Admitting: Pharmacist

## 2021-02-21 NOTE — Telephone Encounter (Signed)
Patient's PA for Repatha was denied by [place].  Appeal faxed.

## 2021-04-19 MED ORDER — REPATHA SURECLICK 140 MG/ML ~~LOC~~ SOAJ: 140.0000 mg | SUBCUTANEOUS | 11 refills | Status: DC

## 2021-04-19 NOTE — Telephone Encounter (Signed)
Called and lmomed the pt that they were approved for repatha, rx sent, instructed the pt not to forget their upcoming lab appt post 4th dose and instructed them to call back if they didn't already have a copay card and that we would be happy to assist them in getting one

## 2021-04-19 NOTE — Addendum Note (Signed)
Addended by: Allean Found on: 04/19/2021 04:44 PM   Modules accepted: Orders

## 2021-04-23 ENCOUNTER — Telehealth: Payer: Self-pay | Admitting: Pharmacist

## 2021-04-23 NOTE — Telephone Encounter (Signed)
Pt called clinic to move lipid appt out, Repatha had been denied and pt just started on injections last night. Lipid panel moved out to Oct.

## 2021-04-26 ENCOUNTER — Other Ambulatory Visit: Payer: BC Managed Care – PPO

## 2021-06-19 ENCOUNTER — Other Ambulatory Visit: Payer: BC Managed Care – PPO | Admitting: *Deleted

## 2021-06-19 ENCOUNTER — Other Ambulatory Visit: Payer: Self-pay

## 2021-06-19 DIAGNOSIS — E782 Mixed hyperlipidemia: Secondary | ICD-10-CM | POA: Diagnosis not present

## 2021-06-19 DIAGNOSIS — M791 Myalgia, unspecified site: Secondary | ICD-10-CM

## 2021-06-19 LAB — LIPID PANEL
Chol/HDL Ratio: 2.7 ratio (ref 0.0–5.0)
Cholesterol, Total: 87 mg/dL — ABNORMAL LOW (ref 100–199)
HDL: 32 mg/dL — ABNORMAL LOW (ref >39)
LDL Chol Calc (NIH): 34 mg/dL (ref 0–99)
Triglycerides: 117 mg/dL (ref 0–149)
VLDL Cholesterol Cal: 21 mg/dL (ref 5–40)

## 2021-06-20 ENCOUNTER — Telehealth: Payer: Self-pay

## 2021-06-20 ENCOUNTER — Telehealth: Payer: Self-pay | Admitting: Pharmacist

## 2021-06-20 NOTE — Telephone Encounter (Signed)
Patient called regarding Repatha and states he is doing well and labs look good.  Please call @ [phone] to further discuss.  Thank you

## 2021-06-20 NOTE — Telephone Encounter (Signed)
Patient called back.  LDL significantly decreased on Repatha.  Will continue to take every 2 weeks.  Update lipid panel at next yearly visit with PCP or cardiologist

## 2021-06-20 NOTE — Telephone Encounter (Signed)
[facility] lab results look excellent, continue current medications

## 2022-02-27 ENCOUNTER — Encounter: Payer: Self-pay | Admitting: Cardiovascular Disease

## 2022-02-27 ENCOUNTER — Ambulatory Visit (INDEPENDENT_AMBULATORY_CARE_PROVIDER_SITE_OTHER): Payer: BC Managed Care – PPO | Admitting: Cardiovascular Disease

## 2022-02-27 VITALS — BP 106/70 | HR 69 | Ht 71.0 in | Wt 238.0 lb

## 2022-02-27 DIAGNOSIS — E782 Mixed hyperlipidemia: Secondary | ICD-10-CM

## 2022-02-27 DIAGNOSIS — R931 Abnormal findings on diagnostic imaging of heart and coronary circulation: Secondary | ICD-10-CM | POA: Diagnosis not present

## 2022-02-27 NOTE — Progress Notes (Signed)
Cardiology Office Note:    Date:  02/27/2022   ID:  Jeffery Evans, DOB 10/17/1955, MRN [id number]  PCP:  Jeffery Solian, MD   [facility] Providers Cardiologist:  Jeffery Mocha, MD     Referring MD: Jeffery Solian, MD   Chief Complaint  Patient presents with   Hyperlipidemia    History of Present Illness:    Jeffery Evans is a 66 y.o. male with a hx of elevated coronary calcium score, presenting for follow-up evaluation.  Coronary calcium score was 199, placing him at the 69th percentile for age and gender.  He has a strong family history of coronary artery disease in his brother.  He has been intolerant to multiple statin agents over time.  He was referred to the lipid clinic last year and is now on [place].  He brings in labs, demonstrating a cholesterol of 107, LDL 51, HDL 32.  The patient is here alone today.  He has been doing very well.  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.  He states that at work it is not uncommon for him to walk over 10,000 steps per day.  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  He does very active physical work and has no exertional symptoms.  Specifically denies chest pain, chest pressure, shortness of breath, leg claudication symptoms, or edema.  He has no heart palpitations.  He brings in labs from 12/10/2021 showing a normal CBC with hemoglobin 16.4, white blood cell count 6.8, platelet count 281,000, creatinine 1.08, potassium 4.5, AST 27, ALT 40, total cholesterol 107, triglycerides 137, HDL 32, and LDL 51.  His hemoglobin A1c is mildly elevated at 5.7 and his fasting glucose is 107.  Past Medical History:  Diagnosis Date   Allergy    Cataract    BEGINNING BILATERAL   Hyperlipidemia    Kidney stones     Past Surgical History:  Procedure Laterality Date   chest tube placement     CHOLECYSTECTOMY     HYDROCELE EXCISION / REPAIR     NASAL SINUS SURGERY     UPPER GASTROINTESTINAL  ENDOSCOPY     VASECTOMY      Current Medications: Current Meds  Medication Sig   aspirin EC 81 MG tablet Take 1 tablet (81 mg total) by mouth daily. Swallow whole.   Cholecalciferol (VITAMIN D3 PO) Take 5,000 Units by mouth daily.   Coenzyme Q10 300 MG CAPS Take by mouth daily.   Evolocumab (REPATHA SURECLICK) 962 MG/ML SOAJ Inject 140 mg into the skin every 14 (fourteen) days.   fluticasone (FLONASE) 50 MCG/ACT nasal spray Place 1 spray into both nostrils daily as needed for allergies.   Multiple Vitamin (MULTIVITAMIN) tablet Take 1 tablet by mouth daily.   Multiple Vitamins-Minerals (PRESERVISION AREDS 2) CAPS Take 1 capsule by mouth daily.   omeprazole (PRILOSEC) 20 MG capsule Take 20 mg by mouth as needed (for heartburn).     Allergies:   Avelox [moxifloxacin hcl in nacl], Crestor [rosuvastatin], Lipitor [atorvastatin], Pravastatin, and Simvastatin   Social History   Socioeconomic History   Marital status: Married    Spouse name: Not on file   Number of children: Not on file   Years of education: Not on file   Highest education level: Not on file  Occupational History   Not on file  Tobacco Use   Smoking status: Former    Years: 7.00    Types: Cigarettes  Quit date: 08/19/1979    Years since quitting: 42.5   Smokeless tobacco: Never  Vaping Use   Vaping Use: Never used  Substance and Sexual Activity   Alcohol use: No   Drug use: No   Sexual activity: Not on file  Other Topics Concern   Not on file  Social History Narrative   Not on file   Social Determinants of Health   Financial Resource Strain: Not on file  Food Insecurity: Not on file  Transportation Needs: Not on file  Physical Activity: Not on file  Stress: Not on file  Social Connections: Not on file     Family History: The patient's family history includes Colon cancer in his maternal uncle; Colon polyps in his brother and mother; Heart disease in his father. There is no history of Esophageal  cancer, Rectal cancer, or Stomach cancer.  ROS:   Please see the history of present illness.    All other systems reviewed and are negative.  EKGs/Labs/Other Studies Reviewed:    The following studies were reviewed today: Stress Myoview 02/14/21: The left ventricular ejection fraction is normal (55-65%). Nuclear stress EF: 60%. Blood pressure demonstrated a normal response to exercise. There was no ST segment deviation noted during stress. The study is normal. This is a low risk study.   No ischemia or infarction on perfusion images. Normal wall motion.    EKG:  EKG is ordered today.  The ekg ordered today demonstrates normal sinus rhythm 64 bpm with occasional PVC, left axis deviation.  No significant change from previous tracing.  Recent Labs: No results found for requested labs within last 365 days.  Recent Lipid Panel    Component Value Date/Time   CHOL 87 (L) 06/19/2021 0758   TRIG 117 06/19/2021 0758   HDL 32 (L) 06/19/2021 0758   CHOLHDL 2.7 06/19/2021 0758   LDLCALC 34 06/19/2021 0758     Risk Assessment/Calculations:           Physical Exam:    VS:  BP 106/70   Pulse 69   Ht '5\' 11"'$  (1.803 m)   Wt 238 lb (108 kg)   SpO2 96%   BMI 33.19 kg/m     Wt Readings from Last 3 Encounters:  02/27/22 238 lb (108 kg)  02/14/21 239 lb (108.4 kg)  01/29/21 239 lb 9.6 oz (108.7 kg)     GEN:  Well nourished, well developed in no acute distress HEENT: Normal NECK: No JVD; No carotid bruits LYMPHATICS: No lymphadenopathy CARDIAC: RRR, no murmurs, rubs, gallops RESPIRATORY:  Clear to auscultation without rales, wheezing or rhonchi  ABDOMEN: Soft, non-tender, non-distended MUSCULOSKELETAL:  No edema; No deformity  SKIN: Warm and dry NEUROLOGIC:  Alert and oriented x 3 PSYCHIATRIC:  Normal affect   ASSESSMENT:    1. Mixed hyperlipidemia   2. Elevated coronary artery calcium score    PLAN:    In order of problems listed above:  The patient is statin  intolerant.  He has done very well with [place].  He brings in recent labs showing a cholesterol of 107 and an LDL cholesterol of 51.  Transaminases are normal with an AST of 27 and an ALT of 40. Negative stress test last year.  Physically active with no cardiac symptoms.  Continue aspirin 81 mg daily and Repatha.  Follow-up 2 years.           Medication Adjustments/Labs and Tests Ordered: Current medicines are reviewed at length with the patient today.  Concerns regarding medicines are outlined above.  Orders Placed This Encounter  Procedures   EKG 12-Lead   No orders of the defined types were placed in this encounter.   Patient Instructions  Medication Instructions:  Your physician recommends that you continue on your current medications as directed. Please refer to the Current Medication list given to you today.   *If you need a refill on your cardiac medications before your next appointment, please call your pharmacy*   Lab Work: NONE If you have labs (blood work) drawn today and your tests are completely normal, you will receive your results only by: [place] (if you have MyChart) OR A paper copy in the mail If you have any lab test that is abnormal or we need to change your treatment, we will call you to review the results.   Testing/Procedures: NONE   Follow-Up: At [facility], you and your health needs are our priority.  As part of our continuing mission to provide you with exceptional heart care, we have created designated Provider Care Teams.  These Care Teams include your primary Cardiologist (physician) and Advanced Practice Providers (APPs -  Physician Assistants and Nurse Practitioners) who all work together to provide you with the care you need, when you need it.  We recommend signing up for the patient portal called "MyChart".  Sign up information is provided on this After Visit Summary.  MyChart is used to connect with patients for Virtual Visits  (Telemedicine).  Patients are able to view lab/test results, encounter notes, upcoming appointments, etc.  Non-urgent messages can be sent to your provider as well.   To learn more about what you can do with MyChart, go to [email or web address].    Your next appointment:   2 year(s)  The format for your next appointment:   In Person  Provider:   Sherren Mocha, MD      Important Information About Sugar         Signed, Jeffery Mocha, MD  02/27/2022 8:35 AM    [place]

## 2022-02-27 NOTE — Patient Instructions (Signed)
Medication Instructions:  Your physician recommends that you continue on your current medications as directed. Please refer to the Current Medication list given to you today.   *If you need a refill on your cardiac medications before your next appointment, please call your pharmacy*   Lab Work: NONE If you have labs (blood work) drawn today and your tests are completely normal, you will receive your results only by: [place] (if you have MyChart) OR A paper copy in the mail If you have any lab test that is abnormal or we need to change your treatment, we will call you to review the results.   Testing/Procedures: NONE   Follow-Up: At [facility], you and your health needs are our priority.  As part of our continuing mission to provide you with exceptional heart care, we have created designated Provider Care Teams.  These Care Teams include your primary Cardiologist (physician) and Advanced Practice Providers (APPs -  Physician Assistants and Nurse Practitioners) who all work together to provide you with the care you need, when you need it.  We recommend signing up for the patient portal called "MyChart".  Sign up information is provided on this After Visit Summary.  MyChart is used to connect with patients for Virtual Visits (Telemedicine).  Patients are able to view lab/test results, encounter notes, upcoming appointments, etc.  Non-urgent messages can be sent to your provider as well.   To learn more about what you can do with MyChart, go to [email or web address].    Your next appointment:   2 year(s)  The format for your next appointment:   In Person  Provider:   Sherren Mocha, MD      Important Information About Sugar

## 2022-05-28 ENCOUNTER — Telehealth: Payer: Self-pay | Admitting: Cardiovascular Disease

## 2022-05-28 NOTE — Telephone Encounter (Signed)
Thx agree

## 2022-05-28 NOTE — Telephone Encounter (Signed)
Pt is calling to ask Dr. Burt Knack if he could be a work in to be seen by him today.  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  He states his apple watch has been alarming him that he is in afib.  He states he can definitely feel the irregularity and feels a bit jittery.  He denies any other cardiac symptoms.   He states he ran several strips on himself showing this and can bring those into the visit.   Pt aware I will go and speak with Dr. Burt Knack about this and call him back here shortly.  He states he has another Anesthesiologist covering for him right now and can come here in less than 5 mins.   He has no history of afib.   Will speak with Dr. Burt Knack and call the pt accordingly thereafter. Pt agreed to plan.

## 2022-05-28 NOTE — Telephone Encounter (Signed)
Called Dr. Royce Macadamia back with Dr. Antionette Char recommendations.  He is aware that Dr. Burt Knack is unable to see him today, but he advised that we could bring him into the clinic tomorrow to see the DOD or an APP.   Dr. Burt Knack also advised he could see the pt back at his next clinic day, but that wouldn't be until next Wed.  No DOD slots available for tomorrow 9/21.  Was able to get the pt an appt to see Christen Bame NP for tomorrow 9/21 at 2:45 pm.  Advised him to arrive 15 mins prior to this appt.  Advised the pt to bring his EKG strips with him to this visit, as well as try to upload the one's from his apple watch to mychart, so Sharyn Lull has them to review and compare, and just incase he converts between now and his appt tomorrow.   ED precautions provided if symptoms were to occur, persist, or worsen with his afib, between now and his visit tomorrow.  Pt verbalized understanding and agrees with this plan.  He states he will bring the strips in and upload the ones from his apple watch to mychart.  He states he will have his Partner cover, so he can attend this visit.   Pt was gracious for all the assistance provided.   Will send this message to Dr. Burt Knack and RN, to make them aware of this plan.

## 2022-05-28 NOTE — Telephone Encounter (Signed)
Patient c/o Palpitations:  High priority if patient c/o lightheadedness, shortness of breath, or chest pain  How long have you had palpitations/irregular HR/ Afib? Are you having the symptoms now? No  Are you currently experiencing lightheadedness, SOB or CP? No  Do you have a history of afib (atrial fibrillation) or irregular heart rhythm? No  Have you checked your BP or HR? (document readings if available):  No  Are you experiencing any other symptoms?   Patient stated he ran EKGs on his watch and he is in Afib.

## 2022-05-29 ENCOUNTER — Encounter: Payer: Self-pay | Admitting: Nurse Practitioner

## 2022-05-29 ENCOUNTER — Ambulatory Visit (INDEPENDENT_AMBULATORY_CARE_PROVIDER_SITE_OTHER): Payer: BC Managed Care – PPO

## 2022-05-29 ENCOUNTER — Ambulatory Visit: Payer: BC Managed Care – PPO | Attending: Nurse Practitioner | Admitting: Nurse Practitioner

## 2022-05-29 VITALS — BP 118/78 | HR 60 | Ht 70.0 in | Wt 238.6 lb

## 2022-05-29 DIAGNOSIS — R931 Abnormal findings on diagnostic imaging of heart and coronary circulation: Secondary | ICD-10-CM | POA: Diagnosis not present

## 2022-05-29 DIAGNOSIS — I4891 Unspecified atrial fibrillation: Secondary | ICD-10-CM | POA: Diagnosis not present

## 2022-05-29 DIAGNOSIS — I480 Paroxysmal atrial fibrillation: Secondary | ICD-10-CM | POA: Diagnosis not present

## 2022-05-29 DIAGNOSIS — E785 Hyperlipidemia, unspecified: Secondary | ICD-10-CM | POA: Diagnosis not present

## 2022-05-29 MED ORDER — APIXABAN 5 MG PO TABS: 5.0000 mg | ORAL_TABLET | Freq: Two times a day (BID) | ORAL | 11 refills | Status: DC

## 2022-05-29 NOTE — Progress Notes (Unsigned)
ZIO XT serial # [id number] from office inventory given to patient.  Dr.Cooper to read.

## 2022-05-29 NOTE — Progress Notes (Signed)
Cardiology Office Note:    Date:  05/29/2022   ID:  Kimsey Demaree, DOB 1956-01-19, MRN [id number]  PCP:  Prince Solian, MD   [facility] Providers Cardiologist:  Sherren Mocha, MD     Referring MD: Prince Solian, MD   Chief Complaint: new atrial fibrillation  History of Present Illness:    Sol Englert is a very pleasant 66 y.o. male with a hx of elevated coronary calcium score 199, 69th % for age/sex. Strong family history of coronary artery disease in his brother.  He has been intolerant to multiple statins over time.  Was referred to lipid clinic now on [place]. Works as Magazine features editor at [employer], not uncommon for him to walk over 10,000 steps per day. 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  No exertional symptoms with very active physical work.  Lab work from 12/10/2021 revealed total cholesterol 107, triglycerides 137, HDL 32, and LDL 51. Hgb A1C mildly elevated at 5.7.   He was last seen in our office on 02/27/22 by Dr. Burt Knack. He reported no concerning symptoms and was advised to return in 2 years.  Contacted our office on 05/28/2022 and reported that he was in A-fib per EKG on his smart watch.  He was scheduled for today's visit and advised to bring those readings with him.  Today, he is here for evaluation of new onset a fib.  Reports he was sitting at work, no symptoms and Apple Watch alarmed.  Revealed atrial fibrillation with consistent HR 110 to 135 bpm.  For approximately 5 to 10 seconds HR was 160 bpm.  Reports he was completely asymptomatic.  He has some tracings on his phone which are not clearly a fib. Reviewed with Dr. Johnsie Cancel, DOD, who agrees that tracings are difficult to discern. Father had a fib and subsequently had a stroke. Usual resting HR is 60 bpm. In NSR today, states he felt himself convert yesterday. He denies chest pain, shortness of breath, lower extremity edema, fatigue, palpitations, melena, hematuria,  hemoptysis, diaphoresis, weakness, presyncope, syncope, orthopnea, and PND. Will be out of town 9/29-10/16/23.   Past Medical History:  Diagnosis Date   Allergy    Cataract    BEGINNING BILATERAL   Hyperlipidemia    Kidney stones     Past Surgical History:  Procedure Laterality Date   chest tube placement     CHOLECYSTECTOMY     HYDROCELE EXCISION / REPAIR     NASAL SINUS SURGERY     UPPER GASTROINTESTINAL ENDOSCOPY     VASECTOMY      Current Medications: Current Meds  Medication Sig   apixaban (ELIQUIS) 5 MG TABS tablet Take 1 tablet (5 mg total) by mouth 2 (two) times daily.   aspirin EC 81 MG tablet Take 1 tablet (81 mg total) by mouth daily. Swallow whole.   Cholecalciferol (VITAMIN D3 PO) Take 5,000 Units by mouth daily.   Coenzyme Q10 300 MG CAPS Take by mouth daily.   Evolocumab (REPATHA SURECLICK) 462 MG/ML SOAJ Inject 140 mg into the skin every 14 (fourteen) days.   fluticasone (FLONASE) 50 MCG/ACT nasal spray Place 1 spray into both nostrils daily as needed for allergies.   Multiple Vitamin (MULTIVITAMIN) tablet Take 1 tablet by mouth daily.   Multiple Vitamins-Minerals (PRESERVISION AREDS 2) CAPS Take 1 capsule by mouth daily.   omeprazole (PRILOSEC) 20 MG capsule Take 20 mg by mouth as needed (for heartburn).  Allergies:   Avelox [moxifloxacin hcl in nacl], Crestor [rosuvastatin], Lipitor [atorvastatin], Pravastatin, and Simvastatin   Social History   Socioeconomic History   Marital status: Married    Spouse name: Not on file   Number of children: Not on file   Years of education: Not on file   Highest education level: Not on file  Occupational History   Not on file  Tobacco Use   Smoking status: Former    Years: 7.00    Types: Cigarettes    Quit date: 08/19/1979    Years since quitting: 42.8   Smokeless tobacco: Never  Vaping Use   Vaping Use: Never used  Substance and Sexual Activity   Alcohol use: No   Drug use: No   Sexual activity: Not on  file  Other Topics Concern   Not on file  Social History Narrative   Not on file   Social Determinants of Health   Financial Resource Strain: Not on file  Food Insecurity: Not on file  Transportation Needs: Not on file  Physical Activity: Not on file  Stress: Not on file  Social Connections: Not on file     Family History: The patient's family history includes Colon cancer in his maternal uncle; Colon polyps in his brother and mother; Heart disease in his father. There is no history of Esophageal cancer, Rectal cancer, or Stomach cancer.  ROS:   Please see the history of present illness.  All other systems reviewed and are negative.  Labs/Other Studies Reviewed:    The following studies were reviewed today:  Exercise Myoview 02/14/21  The left ventricular ejection fraction is normal (55-65%). Nuclear stress EF: 60%. Blood pressure demonstrated a normal response to exercise. There was no ST segment deviation noted during stress. The study is normal. This is a low risk study.   No ischemia or infarction on perfusion images. Normal wall motion.  Coronary CTA 12/14/20  IMPRESSION: 1. Coronary calcium score of 199. This was 69th percentile for age, gender, and race matched controls.   2. Ascending Aorta: 39 mm on non contrasted study- this represents the upper limit of normal for age; unable to correct for body in this study.   3. Mitral Valve: Mild Mitral annular calcification noted.  Recent Labs: No results found for requested labs within last 365 days.  Recent Lipid Panel    Component Value Date/Time   CHOL 87 (L) 06/19/2021 0758   TRIG 117 06/19/2021 0758   HDL 32 (L) 06/19/2021 0758   CHOLHDL 2.7 06/19/2021 0758   LDLCALC 34 06/19/2021 0758     Risk Assessment/Calculations:    CHA2DS2-VASc Score = 3   This indicates a 3.2% annual risk of stroke. The patient's score is based upon: CHF History: 0 HTN History: 1 Diabetes History: 1 Stroke History:  0 Vascular Disease History: 0 Age Score: 1 Gender Score: 0   Physical Exam:    VS:  BP 118/78   Pulse 60   Ht '5\' 10"'$  (1.778 m)   Wt 238 lb 9.6 oz (108.2 kg)   SpO2 95%   BMI 34.24 kg/m     Wt Readings from Last 3 Encounters:  05/29/22 238 lb 9.6 oz (108.2 kg)  02/27/22 238 lb (108 kg)  02/14/21 239 lb (108.4 kg)     GEN: Well developed, obese male in no acute distress HEENT: Normal NECK: No JVD; No carotid bruits CARDIAC: RRR, no murmurs, rubs, gallops RESPIRATORY:  Clear to auscultation without rales, wheezing or  rhonchi  ABDOMEN: Soft, non-tender, non-distended MUSCULOSKELETAL:  No edema; No deformity. 2+ pedal pulses, equal bilaterally SKIN: Warm and dry NEUROLOGIC:  Alert and oriented x 3 PSYCHIATRIC:  Normal affect   EKG:  EKG is ordered today.  The ekg ordered today demonstrates sinus bradycardia at 57 bpm, LAFB   Diagnoses:    1. PAF (paroxysmal atrial fibrillation) (HCC)   2. Elevated coronary artery calcium score   3. Hyperlipidemia LDL goal <70    Assessment and Plan:     Atrial fibrillation: Patient documented possible atrial fibrillation on Apple watch 05/28/2022.  No return of A-fib to his awareness. For a brief time HR was 160 bpm. Tracings on his phone reviewed with Dr. Johnsie Cancel who agrees it is difficult to clearly identify a fib. We will place a 14 day Zio monitor for evaluation of arrhythmia.  We discussed anticoagulation and he would like to go ahead and start Eliquis 5 mg twice daily due to family history of stroke in his father. CHA2DS2-VASc is at least 2, 3 if A1c for prediabetes is considered.  Elevated coronary calcium score: Coronary calcium score of 199, 69th percentile for age/sex 12/2020. Normal nuclear exercise stress test with normal LVEF 02/2021. He denies chest pain, dyspnea, or other symptoms concerning for angina.  No indication for further ischemic evaluation at this time. Continue PCSK9 inhibitor. Is still on aspirin. We can d/c if he  continues chronic anticoagulation for a fib once monitor results are available.   Hyperlipidemia LDL goal < 70: LDL 34 on 06/19/21.  Family history of low HDL.  Continue Repatha.   Disposition: TBD from monitor results   Medication Adjustments/Labs and Tests Ordered: Current medicines are reviewed at length with the patient today.  Concerns regarding medicines are outlined above.  Orders Placed This Encounter  Procedures   LONG TERM MONITOR (3-14 DAYS)   EKG 12-Lead   Meds ordered this encounter  Medications   apixaban (ELIQUIS) 5 MG TABS tablet    Sig: Take 1 tablet (5 mg total) by mouth 2 (two) times daily.    Dispense:  60 tablet    Refill:  11    Patient Instructions  Medication Instructions:   START Eliquis one (1) tablet by mouth ( 5 mg) twice daily.   *If you need a refill on your cardiac medications before your next appointment, please call your pharmacy*   Lab Work:  None ordered.  If you have labs (blood work) drawn today and your tests are completely normal, you will receive your results only by: [place] (if you have MyChart) OR A paper copy in the mail If you have any lab test that is abnormal or we need to change your treatment, we will call you to review the results.   Testing/Procedures:  Bryn Gulling- Long Term Monitor Instructions  Your physician has requested you wear a ZIO patch monitor for 14 days.  This is a single patch monitor. [employer] supplies one patch monitor per enrollment. Additional stickers are not available. Please do not apply patch if you will be having a Nuclear Stress Test,  Echocardiogram, Cardiac CT, MRI, or Chest Xray during the period you would be wearing the  monitor. The patch cannot be worn during these tests. You cannot remove and re-apply the  ZIO XT patch monitor.  Your ZIO patch monitor will be mailed 3 day USPS to your address on file. It may take 3-5 days  to receive your monitor after you have been enrolled.  Once  you have received your monitor, please review the enclosed instructions. Your monitor  has already been registered assigning a specific monitor serial # to you.  Billing and Patient Assistance Program Information  We have supplied [employer] with any of your insurance information on file for billing purposes. [employer] offers a sliding scale Patient Assistance Program for patients that do not have  insurance, or whose insurance does not completely cover the cost of the ZIO monitor.  You must apply for the Patient Assistance Program to qualify for this discounted rate.  To apply, please call [employer] at [phone], select option 4, select option 2, ask to apply for  Patient Assistance Program. Theodore Demark will ask your household income, and how many people  are in your household. They will quote your out-of-pocket cost based on that information.  [employer] will also be able to set up a 11-month interest-free payment plan if needed.  Applying the monitor   Shave hair from upper left chest.  Hold abrader disc by orange tab. Rub abrader in 40 strokes over the upper left chest as  indicated in your monitor instructions.  Clean area with 4 enclosed alcohol pads. Let dry.  Apply patch as indicated in monitor instructions. Patch will be placed under collarbone on left  side of chest with arrow pointing upward.  Rub patch adhesive wings for 2 minutes. Remove white label marked "1". Remove the white  label marked "2". Rub patch adhesive wings for 2 additional minutes.  While looking in a mirror, press and release button in center of patch. A small green light will  flash 3-4 times. This will be your only indicator that the monitor has been turned on.  Do not shower for the first 24 hours. You may shower after the first 24 hours.  Press the button if you feel a symptom. You will hear a small click. Record Date, Time and  Symptom in the Patient Logbook.  When you are ready to remove the patch, follow  instructions on the last 2 pages of Patient  Logbook. Stick patch monitor onto the last page of Patient Logbook.  Place Patient Logbook in the blue and white box. Use locking tab on box and tape box closed  securely. The blue and white box has prepaid postage on it. Please place it in the mailbox as  soon as possible. Your physician should have your test results approximately 7 days after the  monitor has been mailed [facility] to [facility]  Call [place] [phone] you have questions regarding  your ZIO XT patch monitor. Call them immediately if you see an orange light blinking on your  monitor.  If your monitor falls off in less than 4 days, contact our Monitor department at [phone]  If your monitor becomes loose or falls off after 4 days call [employer] at [phone]  suggestions on securing your monitor    Follow-Up: At [facility] you and your health needs are our priority.  As part of our continuing mission to provide you with exceptional heart care, we have created designated Provider Care Teams.  These Care Teams include your primary Cardiologist (physician) and Advanced Practice Providers (APPs -  Physician Assistants and Nurse Practitioners) who all work together to provide you with the care you need, when you need it.  We recommend signing up for the patient portal called "MyChart".  Sign up information is provided on this After Visit Summary.  MyChart is used to  connect with patients for Virtual Visits (Telemedicine).  Patients are able to view lab/test results, encounter notes, upcoming appointments, etc.  Non-urgent messages can be sent to your provider as well.   To learn more about what you can do with MyChart, go to [email or web address].    Your next appointment:     As needed.  The format for your next appointment:   In Person  Provider:   Sherren Mocha, MD      Important Information About Sugar          Signed, Emmaline Life, NP  05/29/2022 5:40 PM    [place]

## 2022-05-29 NOTE — Patient Instructions (Signed)
Medication Instructions:   START Eliquis one (1) tablet by mouth ( 5 mg) twice daily.   *If you need a refill on your cardiac medications before your next appointment, please call your pharmacy*   Lab Work:  None ordered.  If you have labs (blood work) drawn today and your tests are completely normal, you will receive your results only by: [place] (if you have MyChart) OR A paper copy in the mail If you have any lab test that is abnormal or we need to change your treatment, we will call you to review the results.   Testing/Procedures:  Bryn Gulling- Long Term Monitor Instructions  Your physician has requested you wear a ZIO patch monitor for 14 days.  This is a single patch monitor. [employer] supplies one patch monitor per enrollment. Additional stickers are not available. Please do not apply patch if you will be having a Nuclear Stress Test,  Echocardiogram, Cardiac CT, MRI, or Chest Xray during the period you would be wearing the  monitor. The patch cannot be worn during these tests. You cannot remove and re-apply the  ZIO XT patch monitor.  Your ZIO patch monitor will be mailed 3 day USPS to your address on file. It may take 3-5 days  to receive your monitor after you have been enrolled.  Once you have received your monitor, please review the enclosed instructions. Your monitor  has already been registered assigning a specific monitor serial # to you.  Billing and Patient Assistance Program Information  We have supplied [employer] with any of your insurance information on file for billing purposes. [employer] offers a sliding scale Patient Assistance Program for patients that do not have  insurance, or whose insurance does not completely cover the cost of the ZIO monitor.  You must apply for the Patient Assistance Program to qualify for this discounted rate.  To apply, please call [employer] at [phone], select option 4, select option 2, ask to apply for  Patient Assistance  Program. Theodore Demark will ask your household income, and how many people  are in your household. They will quote your out-of-pocket cost based on that information.  [employer] will also be able to set up a 53-month interest-free payment plan if needed.  Applying the monitor   Shave hair from upper left chest.  Hold abrader disc by orange tab. Rub abrader in 40 strokes over the upper left chest as  indicated in your monitor instructions.  Clean area with 4 enclosed alcohol pads. Let dry.  Apply patch as indicated in monitor instructions. Patch will be placed under collarbone on left  side of chest with arrow pointing upward.  Rub patch adhesive wings for 2 minutes. Remove white label marked "1". Remove the white  label marked "2". Rub patch adhesive wings for 2 additional minutes.  While looking in a mirror, press and release button in center of patch. A small green light will  flash 3-4 times. This will be your only indicator that the monitor has been turned on.  Do not shower for the first 24 hours. You may shower after the first 24 hours.  Press the button if you feel a symptom. You will hear a small click. Record Date, Time and  Symptom in the Patient Logbook.  When you are ready to remove the patch, follow instructions on the last 2 pages of Patient  Logbook. Stick patch monitor onto the last page of Patient Logbook.  Place Patient Logbook in the blue and white box.  Use locking tab on box and tape box closed  securely. The blue and white box has prepaid postage on it. Please place it in the mailbox as  soon as possible. Your physician should have your test results approximately 7 days after the  monitor has been mailed back to [facility].  Call [place] at [phone] if you have questions regarding  your ZIO XT patch monitor. Call them immediately if you see an orange light blinking on your  monitor.  If your monitor falls off in less than 4 days, contact our  Monitor department at [phone].  If your monitor becomes loose or falls off after 4 days call [employer] at [phone] for  suggestions on securing your monitor    Follow-Up: At [facility], you and your health needs are our priority.  As part of our continuing mission to provide you with exceptional heart care, we have created designated Provider Care Teams.  These Care Teams include your primary Cardiologist (physician) and Advanced Practice Providers (APPs -  Physician Assistants and Nurse Practitioners) who all work together to provide you with the care you need, when you need it.  We recommend signing up for the patient portal called "MyChart".  Sign up information is provided on this After Visit Summary.  MyChart is used to connect with patients for Virtual Visits (Telemedicine).  Patients are able to view lab/test results, encounter notes, upcoming appointments, etc.  Non-urgent messages can be sent to your provider as well.   To learn more about what you can do with MyChart, go to [email or web address].    Your next appointment:     As needed.  The format for your next appointment:   In Person  Provider:   Sherren Mocha, MD      Important Information About Sugar

## 2022-06-11 IMAGING — CT CT CARDIAC CORONARY ARTERY CALCIUM SCORE
2 series · 15 of 20 positions shown, 17 images · non-contrast
Comparison: None.
COMPARISON: None.

Addendum:
EXAM:
OVER-READ INTERPRETATION  CT CHEST

The following report is an over-read performed by radiologist Dr.
over-read does not include interpretation of cardiac or coronary
anatomy or pathology. The coronary calcium score interpretation by
the cardiologist is attached.
CLINICAL DATA: Risk stratification: 64 Year-old White Male
Coronary Calcium Score
TECHNIQUE: The patient was scanned on a Siemens Force scanner. Axial
non-contrast 3 mm slices were carried out through the heart. The
data set was analyzed on a dedicated work station and scored using
the Agatson method.

[Series 2: casc 3.0 i36f 2 (id) · axial · 0.43mm/px · z∈[-252,-153]mm · 8 of 43 slices shown, 10 images]
[im 5/43  vessel]
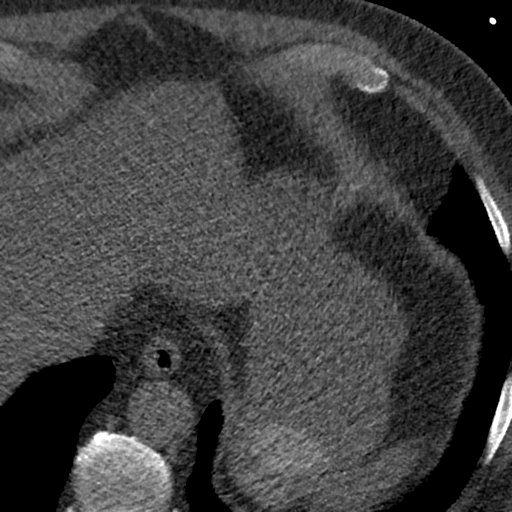
[im 5/43  lung]
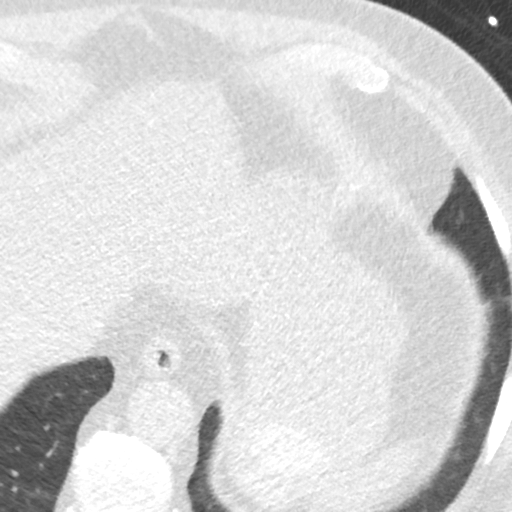
[im 10/43  vessel]
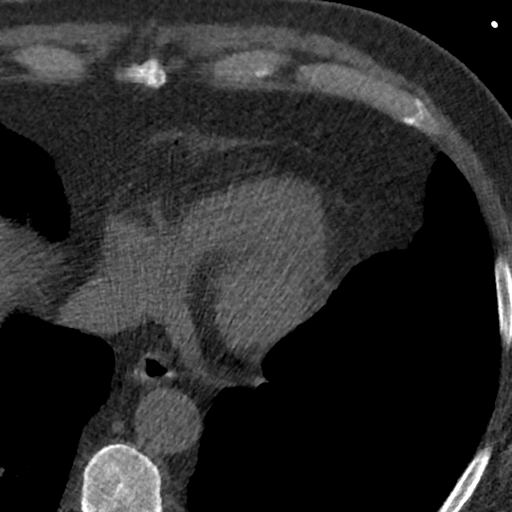
[im 15/43  vessel]
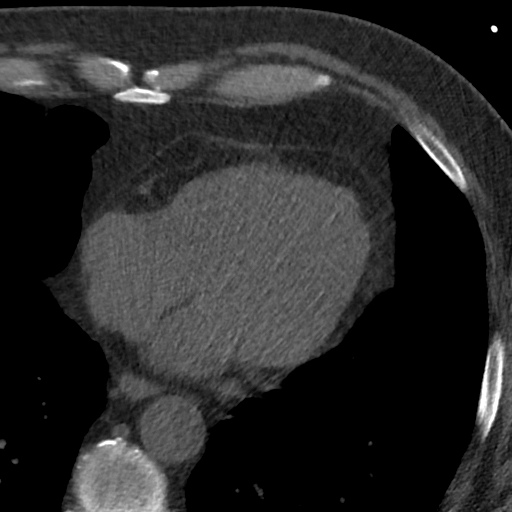
[im 19/43  vessel]
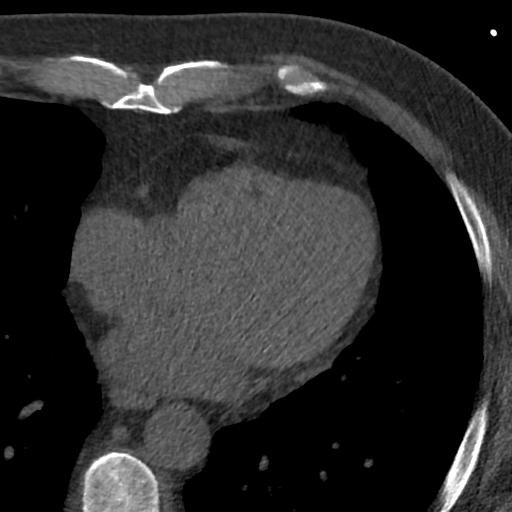
[im 24/43  vessel]
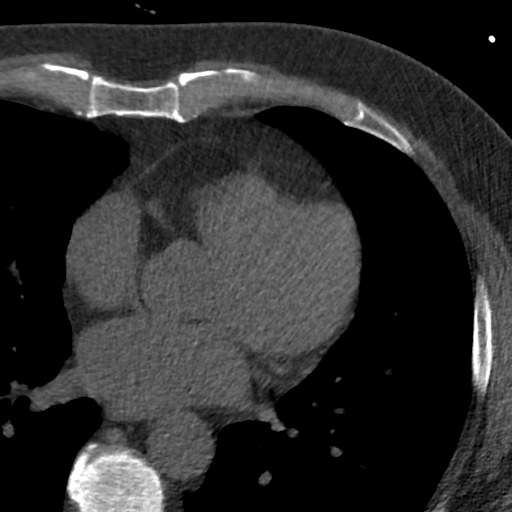
[im 24/43  lung]
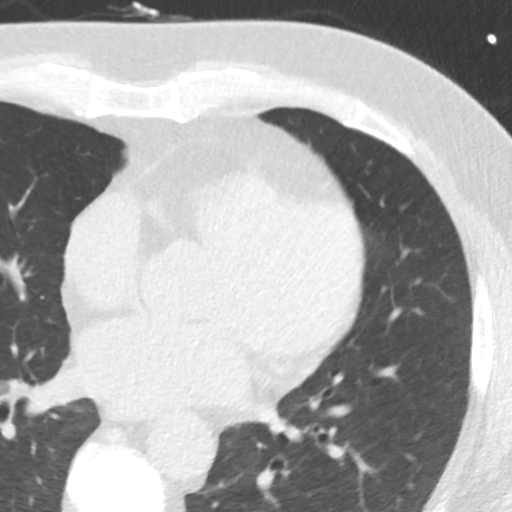
[im 29/43  vessel]
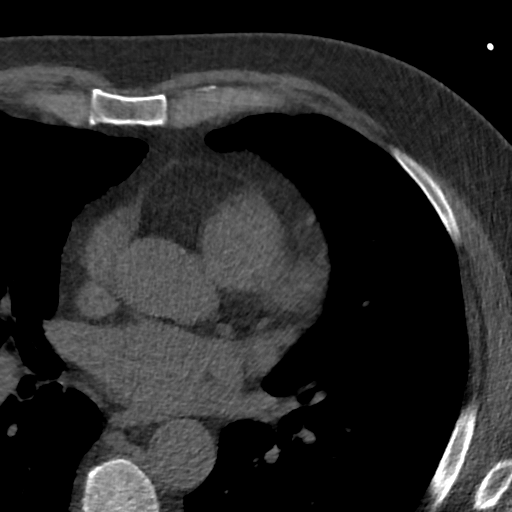
[im 33/43  vessel]
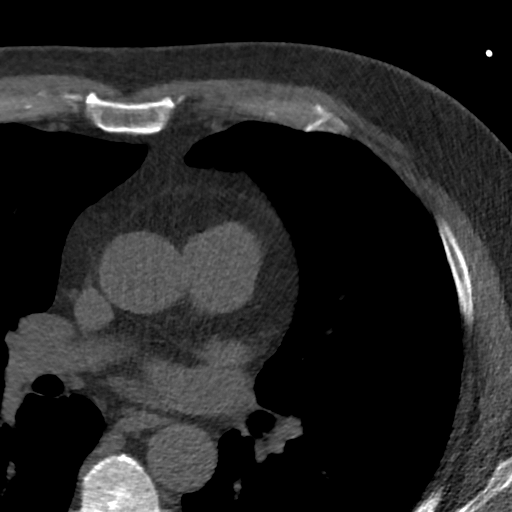
[im 38/43  vessel]
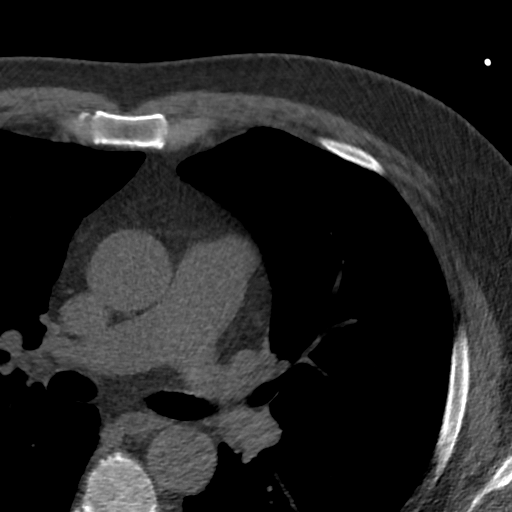

[Series 4: lung st · axial · 0.79mm/px · z∈[-252,-168]mm · 7 of 43 slices shown]
[im 5/43  lung]
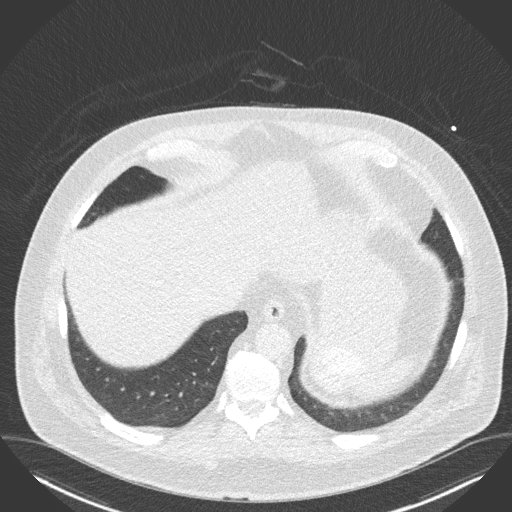
[im 10/43  lung]
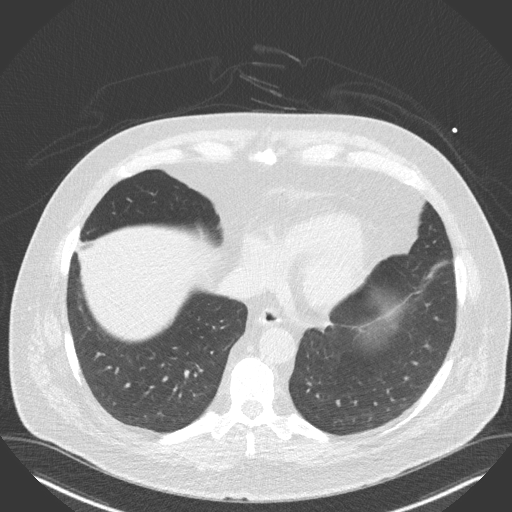
[im 15/43  lung]
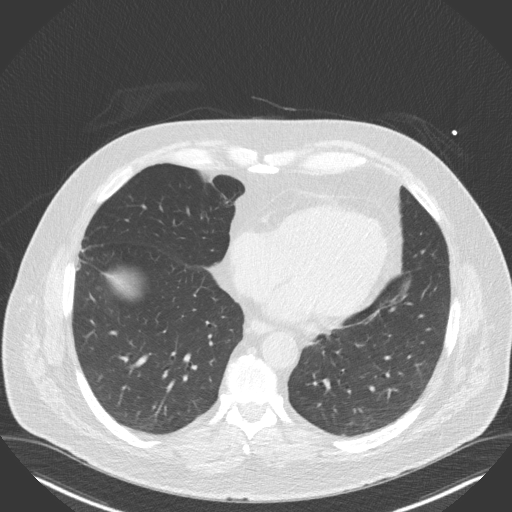
[im 19/43  lung]
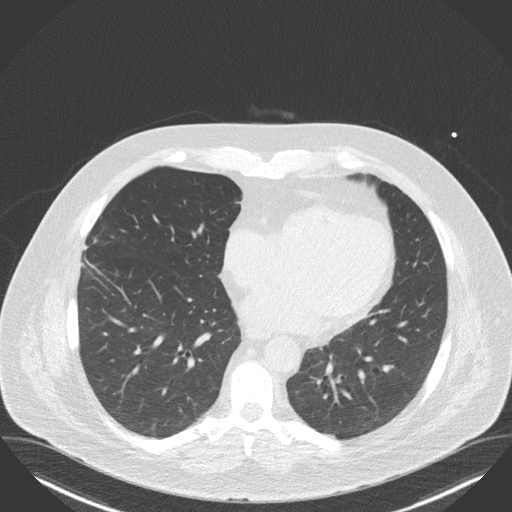
[im 24/43  lung]
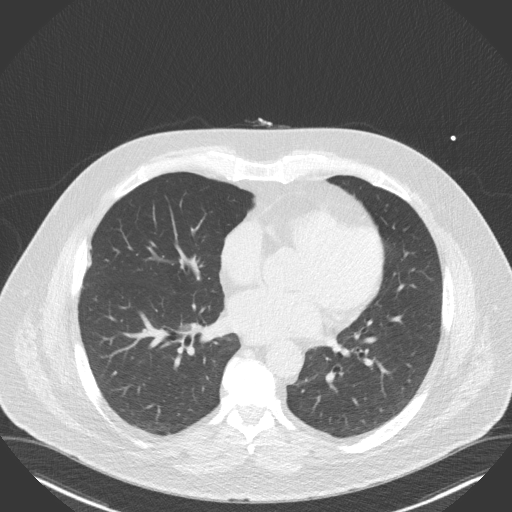
[im 29/43  lung]
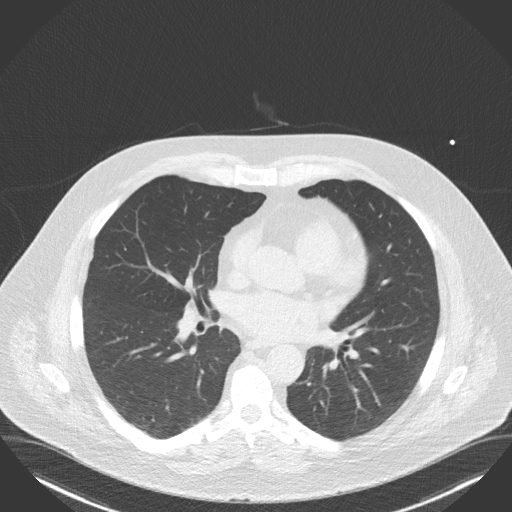
[im 33/43  lung]
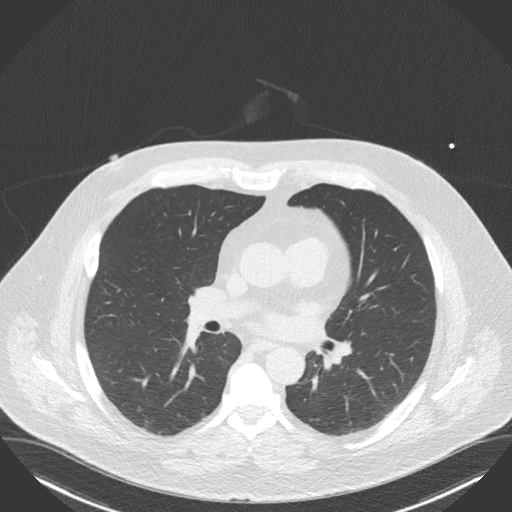

[15 of 20 positions shown; findings below may reference images not displayed]

FINDINGS: Vascular: No significant noncardiac vascular findings.

Mediastinum/Nodes: Visualized mediastinum and hilar regions
demonstrate no lymphadenopathy or masses.

Lungs/Pleura: There is some scarring in the peripheral subpleural
right middle lobe. Visualized lungs show no evidence of pulmonary
edema, consolidation, pneumothorax, nodule or pleural fluid.

Upper Abdomen: No acute abnormality.

Musculoskeletal: No chest wall mass or suspicious bone lesions
identified.
IMPRESSION: No significant incidental findings.
FINDINGS: Non-cardiac: See separate report from [REDACTED].

Ascending Aorta: 39 mm on non-contrasted study.

Mitral Valve: Mild Mitral annular calcification noted.

Pericardium: Normal.

Coronary arteries: Normal origins.

Coronary Calcium Score:

Left main: 0

Left anterior descending artery: 194

Left circumflex artery: 0

Right coronary artery: 5

Total: 199

Percentile: 69th for age, sex, and race matched control.
IMPRESSION: 1. Coronary calcium score of 199. This was 69th percentile for age,
gender, and race matched controls.

2. Ascending Aorta: 39 mm on non contrasted study- this represents
the upper limit of normal for age; unable to correct for body in
this study.

3. Mitral Valve: Mild Mitral annular calcification noted.

RECOMMENDATIONS:

Coronary artery calcium (CAC) score is a strong predictor of
incident coronary heart disease (CHD) and provides predictive
information beyond traditional risk factors. CAC scoring is
reasonable to use in the decision to withhold, postpone, or initiate
statin therapy in intermediate-risk or selected borderline-risk
asymptomatic adults (age 40-75 years and LDL-C ?70 to <190 mg/dL)
who do not have diabetes or established atherosclerotic
cardiovascular disease (ASCVD).* In intermediate-risk (10-year ASCVD
risk ?7.5% to <20%) adults or selected borderline-risk (10-year
ASCVD risk ?5% to <7.5%) adults in whom a CAC score is measured for
the purpose of making a treatment decision the following
recommendations have been made:

If CAC = 0, it is reasonable to withhold statin therapy and reassess
in 5 to 10 years, as long as higher risk conditions are absent
(diabetes mellitus, family history of premature CHD in first degree
relatives (males <55 years; females <65 years), cigarette smoking,
LDL ?190 mg/dL or other independent risk factors).

If CAC is 1 to 99, it is reasonable to initiate statin therapy for
patients ?55 years of age.

If CAC is ?100 or ?75th percentile, it is reasonable to initiate
statin therapy at any age.

Cardiology referral should be considered for patients with CAC
scores ?400 or ?75th percentile.

*1439 AHA/ACC/AACVPR/AAPA/ABC/GAUSDEN/MARKS/JULLIETH/[place]/LESYA/JOSEPHINE/SIETARABALAGA
Guideline on the Management of Blood Cholesterol: A Report of the
American College of Cardiology/American Heart Association Task Force
on Clinical Practice Guidelines. J Am Coll Cardiol.
3792;73(24):9707-9018.

*** End of Addendum ***
EXAM:
OVER-READ INTERPRETATION  CT CHEST

The following report is an over-read performed by radiologist Dr.
over-read does not include interpretation of cardiac or coronary
anatomy or pathology. The coronary calcium score interpretation by
the cardiologist is attached.
FINDINGS: Vascular: No significant noncardiac vascular findings.

Mediastinum/Nodes: Visualized mediastinum and hilar regions
demonstrate no lymphadenopathy or masses.

Lungs/Pleura: There is some scarring in the peripheral subpleural
right middle lobe. Visualized lungs show no evidence of pulmonary
edema, consolidation, pneumothorax, nodule or pleural fluid.

Upper Abdomen: No acute abnormality.

Musculoskeletal: No chest wall mass or suspicious bone lesions
identified.
IMPRESSION: No significant incidental findings.

## 2022-06-16 DIAGNOSIS — I4891 Unspecified atrial fibrillation: Secondary | ICD-10-CM | POA: Diagnosis not present

## 2022-06-25 ENCOUNTER — Telehealth: Payer: Self-pay | Admitting: Cardiovascular Disease

## 2022-06-25 NOTE — Telephone Encounter (Signed)
Discussed ZIO monitor result with Dr. Burt Knack. Per Dr. Burt Knack "Left a message on patient's voicemail.  I am inclined to discontinue apixaban since we have not seen any clear atrial fibrillation.  I will try to reach him back. "  Will request triage team please advise patient of these recommendations.   Loel Dubonnet, NP

## 2022-06-25 NOTE — Telephone Encounter (Signed)
Patient called and mentioned that Dr. Burt Knack and nurse was supposed to talk with patient regarding medication

## 2022-06-27 NOTE — Telephone Encounter (Signed)
Called and spoke with patient who states he has since spoke with Dr Burt Knack and did stop his Eliquis about 2 days ago. Will update chart at this time.

## 2022-06-27 NOTE — Addendum Note (Signed)
Addended by: Ma Hillock on: 06/27/2022 09:29 AM   Modules accepted: Orders

## 2022-06-30 ENCOUNTER — Telehealth: Payer: Self-pay

## 2022-06-30 DIAGNOSIS — R002 Palpitations: Secondary | ICD-10-CM

## 2022-06-30 NOTE — Telephone Encounter (Signed)
-----   Message from Sherren Mocha, MD sent at 06/29/2022 11:21 PM EDT ----- Reviewed monitor result with patient. Stop eliquis. Please order echo to assess for any structural heart abnormality. Dx: palpitations

## 2022-06-30 NOTE — Telephone Encounter (Signed)
MD reviewed monitor results and informed pt to stop Eliquis (already removed from [facility]). Order for ECHO placed at this time.

## 2022-07-10 ENCOUNTER — Ambulatory Visit (HOSPITAL_COMMUNITY): Payer: BC Managed Care – PPO | Attending: Cardiology

## 2022-07-10 DIAGNOSIS — R002 Palpitations: Secondary | ICD-10-CM | POA: Diagnosis not present

## 2022-07-10 LAB — ECHOCARDIOGRAM COMPLETE
Area-P 1/2: 2.87 cm2
S' Lateral: 2.9 cm

## 2022-09-04 ENCOUNTER — Encounter: Payer: Self-pay | Admitting: Cardiovascular Disease

## 2022-09-04 NOTE — Progress Notes (Signed)
Pt reached out and sent me rhythm strips confirming recurrence of atrial fibrillation. He is back in sinus also confirmed on a rhythm strip. He will start back on Eliquis 5 mg BID. Please update his Rx. He will reach out if further problems, otherwise plan follow-up in about 3 months. Thanks  Reyden Smith 09/04/2022 5:14 PM

## 2022-09-12 ENCOUNTER — Other Ambulatory Visit: Payer: Self-pay | Admitting: Cardiovascular Disease

## 2022-09-12 MED ORDER — APIXABAN 5 MG PO TABS: 5.0000 mg | ORAL_TABLET | Freq: Two times a day (BID) | ORAL | 3 refills | Status: DC

## 2022-09-12 NOTE — Progress Notes (Signed)
Pt reached out and sent me rhythm strips confirming recurrence of atrial fibrillation. He is back in sinus also confirmed on a rhythm strip. He will start back on Eliquis 5 mg BID. Please update his Rx. He will reach out if further problems, otherwise plan follow-up in about 3 months. Thanks   Jadis Mika 09/04/2022 5:14 PM     Sent into pharmacy at this time.

## 2022-09-17 ENCOUNTER — Other Ambulatory Visit (HOSPITAL_COMMUNITY): Payer: Self-pay

## 2022-09-19 ENCOUNTER — Telehealth: Payer: Self-pay

## 2022-09-19 NOTE — Telephone Encounter (Signed)
Pharmacy Patient Advocate Encounter  Prior Authorization for REPATHA 140 MG/ML INJ has been approved.    Effective dates: 09/17/22 through 09/16/23   Received notification from [facility] that prior authorization for REPATHA 140 MG/ML INJ is needed.    PA submitted on 09/17/22 Key [id number] Status is pending  Karie Soda, [place] Patient Advocate Specialist Direct Number: [phone] Fax: [phone]

## 2022-10-25 ENCOUNTER — Encounter (HOSPITAL_BASED_OUTPATIENT_CLINIC_OR_DEPARTMENT_OTHER): Payer: Self-pay

## 2022-10-25 ENCOUNTER — Other Ambulatory Visit: Payer: Self-pay

## 2022-10-25 ENCOUNTER — Emergency Department (HOSPITAL_BASED_OUTPATIENT_CLINIC_OR_DEPARTMENT_OTHER)
Admission: EM | Admit: 2022-10-25 | Discharge: 2022-10-25 | Disposition: A | Discharge status: Home / Self Care | Payer: BC Managed Care – PPO | Attending: Emergency Medicine | Admitting: Emergency Medicine

## 2022-10-25 DIAGNOSIS — Z7982 Long term (current) use of aspirin: Secondary | ICD-10-CM | POA: Insufficient documentation

## 2022-10-25 DIAGNOSIS — Z7901 Long term (current) use of anticoagulants: Secondary | ICD-10-CM | POA: Insufficient documentation

## 2022-10-25 DIAGNOSIS — R002 Palpitations: Secondary | ICD-10-CM | POA: Diagnosis not present

## 2022-10-25 DIAGNOSIS — I4891 Unspecified atrial fibrillation: Secondary | ICD-10-CM | POA: Diagnosis not present

## 2022-10-25 DIAGNOSIS — Z87891 Personal history of nicotine dependence: Secondary | ICD-10-CM | POA: Insufficient documentation

## 2022-10-25 LAB — BASIC METABOLIC PANEL
Anion gap: 9 (ref 5–15)
BUN: 14 mg/dL (ref 8–23)
CO2: 26 mmol/L (ref 22–32)
Calcium: 9.4 mg/dL (ref 8.9–10.3)
Chloride: 106 mmol/L (ref 98–111)
Creatinine, Ser: 0.95 mg/dL (ref 0.61–1.24)
GFR, Estimated: >60 mL/min (ref >60)
Glucose, Bld: 113 mg/dL — ABNORMAL HIGH (ref 70–99)
Potassium: 3.9 mmol/L (ref 3.5–5.1)
Sodium: 141 mmol/L (ref 135–145)

## 2022-10-25 LAB — CBC WITH DIFFERENTIAL/PLATELET
Abs Immature Granulocytes: 0.02 10*3/uL (ref 0.00–0.07)
Basophils Absolute: 0 10*3/uL (ref 0.0–0.1)
Basophils Relative: 0 %
Eosinophils Absolute: 0.2 10*3/uL (ref 0.0–0.5)
Eosinophils Relative: 3 %
HCT: 47.8 % (ref 39.0–52.0)
Hemoglobin: 16.4 g/dL (ref 13.0–17.0)
Immature Granulocytes: 0 %
Lymphocytes Relative: 28 %
Lymphs Abs: 2 10*3/uL (ref 0.7–4.0)
MCH: 31.1 pg (ref 26.0–34.0)
MCHC: 34.3 g/dL (ref 30.0–36.0)
MCV: 90.5 fL (ref 80.0–100.0)
Monocytes Absolute: 0.5 10*3/uL (ref 0.1–1.0)
Monocytes Relative: 7 %
Neutro Abs: 4.3 10*3/uL (ref 1.7–7.7)
Neutrophils Relative %: 62 %
Platelets: 231 10*3/uL (ref 150–400)
RBC: 5.28 MIL/uL (ref 4.22–5.81)
RDW: 12.1 % (ref 11.5–15.5)
WBC: 7 10*3/uL (ref 4.0–10.5)
nRBC: 0 % (ref 0.0–0.2)

## 2022-10-25 NOTE — Discharge Instructions (Signed)
EKG today consistent with atrial fibrillation which proves with a been suspicious of for a while.  Continue to take your Eliquis as you have been.  Give Dr. Antionette Char office a call.  We drew a CBC and basic metabolic panel to check basic labs.  Follow them up on MyChart.  Return for any new or worse symptoms.  Or rapid heart rate lasting an hour or longer.

## 2022-10-25 NOTE — ED Triage Notes (Signed)
In for eval of possible A-Fib onset 2230 last pm. On eliquis. Took bystolic x2 last [id number] this am.

## 2022-10-25 NOTE — ED Provider Notes (Signed)
[facility] Provider Note   CSN: [id number] Arrival date & time: 10/25/22  [id number]     History  Chief Complaint  Patient presents with   Palpitations    Jeffery Evans is a 67 y.o. male.  Patient presents today with concerns for atrial fibrillation.  He does not really get a lot of symptoms.  He says onset he thinks was at 20-30 last p.m.  Has been on Eliquis now for a month.  Took by systolic x 2 last at [id number] this morning.  Which may be controlling the rate.  Rate here has been 100-80.  Patient completely asymptomatic.  Followed by Dr. Burt Knack cardiology with [facility].  They have not been able to formally document the atrial fibs.  Has been able to document a fast heart rate was monitored at home but they did not see anything.  Here mostly to confirm the atrial fibrillation.  Past medical history significant for hyperlipidemia.  Gallbladder removal.  Previous smoker quit 1980.       Home Medications Prior to Admission medications   Medication Sig Start Date End Date Taking? Authorizing Provider  apixaban (ELIQUIS) 5 MG TABS tablet Take 1 tablet (5 mg total) by mouth 2 (two) times daily. 09/12/22  Yes Sherren Mocha, MD  aspirin EC 81 MG tablet Take 1 tablet (81 mg total) by mouth daily. Swallow whole. 01/29/21  Yes Sherren Mocha, MD  Cholecalciferol (VITAMIN D3 PO) Take 5,000 Units by mouth daily.   Yes [provider]  fluticasone (FLONASE) 50 MCG/ACT nasal spray Place 1 spray into both nostrils daily as needed for allergies.   Yes [provider]  Multiple Vitamin (MULTIVITAMIN) tablet Take 1 tablet by mouth daily.   Yes [provider]  Coenzyme Q10 300 MG CAPS Take by mouth daily.    [provider]  Evolocumab (REPATHA SURECLICK) [id number] MG/ML SOAJ Inject 140 mg into the skin every 14 (fourteen) days. 04/19/21   Sherren Mocha, MD  Multiple Vitamins-Minerals (PRESERVISION AREDS 2) CAPS Take 1 capsule by mouth  daily.    [provider]  omeprazole (PRILOSEC) 20 MG capsule Take 20 mg by mouth as needed (for heartburn).    [provider]      Allergies    Avelox [moxifloxacin hcl in nacl], Crestor [rosuvastatin], Lipitor [atorvastatin], Pravastatin, and Simvastatin    Review of Systems   Review of Systems  Constitutional:  Negative for chills and fever.  HENT:  Negative for ear pain and sore throat.   Eyes:  Negative for pain and visual disturbance.  Respiratory:  Negative for cough and shortness of breath.   Cardiovascular:  Positive for palpitations. Negative for chest pain.  Gastrointestinal:  Negative for abdominal pain and vomiting.  Genitourinary:  Negative for dysuria and hematuria.  Musculoskeletal:  Negative for arthralgias and back pain.  Skin:  Negative for color change and rash.  Neurological:  Negative for seizures and syncope.  All other systems reviewed and are negative.   Physical Exam Updated Vital Signs BP (!) 143/104 (BP Location: Right Arm)   Pulse (!) 102   Temp 97.7 F (36.5 C) (Oral)   Resp 15   Ht 1.803 m (5' 11"$ )   Wt 103.4 kg   SpO2 99%   BMI 31.80 kg/m  Physical Exam Vitals and nursing note reviewed.  Constitutional:      General: He is not in acute distress.    Appearance: Normal appearance. He is well-developed. He  is not ill-appearing, toxic-appearing or diaphoretic.  HENT:     Head: Normocephalic and atraumatic.  Eyes:     Conjunctiva/sclera: Conjunctivae normal.  Cardiovascular:     Rate and Rhythm: Normal rate. Rhythm irregular.     Heart sounds: No murmur heard. Pulmonary:     Effort: Pulmonary effort is normal. No respiratory distress.     Breath sounds: Normal breath sounds.  Abdominal:     Palpations: Abdomen is soft.     Tenderness: There is no abdominal tenderness.  Musculoskeletal:        General: No swelling.     Cervical back: Neck supple.  Skin:    General: Skin is warm and dry.     Capillary Refill:  Capillary refill takes less than 2 seconds.  Neurological:     General: No focal deficit present.     Mental Status: He is alert and oriented to person, place, and time.     Cranial Nerves: No cranial nerve deficit.     Sensory: No sensory deficit.     Motor: No weakness.  Psychiatric:        Mood and Affect: Mood normal.     ED Results / Procedures / Treatments   Labs (all labs ordered are listed, but only abnormal results are displayed) Labs Reviewed  BASIC METABOLIC PANEL  CBC WITH DIFFERENTIAL/PLATELET    EKG EKG Interpretation  Date/Time:  Saturday October 25 2022 09:05:49 EST Ventricular Rate:  95 PR Interval:    QRS Duration: 86 QT Interval:  356 QTC Calculation: 443 R Axis:   -81 Text Interpretation: Atrial fibrillation Left anterior fascicular block Consider anterior infarct No previous ECGs available Confirmed by Fredia Sorrow [phone]) on 10/25/2022 9:13:20 AM  Radiology No results found.  Procedures Procedures    Medications Ordered in ED Medications - No data to display  ED Course/ Medical Decision Making/ A&P                             Medical Decision Making Amount and/or Complexity of Data Reviewed Labs: ordered.   EKG consistent with atrial fibrillation.  Cardiac monitor shows atrial fibrillation with heart rate between 80-100.  So not rapid.  Patient completely asymptomatic.  Patient does not want to wait for the labs.  But drew labs to help his cardiologist Dr. Burt Knack so they got a baseline.  Patient will continue his Eliquis.  Will contact Dr. Burt Knack on Monday to see if he wants to add on any additional medications.  Patient already taking Bisystolic.  Patient was given precautions and will return for any new or worse symptoms.  Final Clinical Impression(s) / ED Diagnoses Final diagnoses:  Atrial fibrillation, unspecified type [facility])    Rx / DC Orders ED Discharge Orders     None         Fredia Sorrow, MD 10/25/22  0945

## 2022-10-28 ENCOUNTER — Encounter: Payer: Self-pay | Admitting: Cardiovascular Disease

## 2022-10-28 NOTE — Progress Notes (Unsigned)
Communicated with the patient who had another episode of atrial fibrillation.  This time it was documented in the [place] emergency room and I have reviewed that EKG.  He is otherwise doing well and has no symptoms at present.  He is taking apixaban for anticoagulation.  I recommended that we refer him to the atrial fibrillation clinic to determine best treatment course with either antiarrhythmic drug therapy or EP referral for consideration of atrial fib ablation.  He would like to pursue referral to the atrial fibrillation clinic.  Jeffery Evans 10/28/2022 4:33 PM

## 2022-10-30 ENCOUNTER — Other Ambulatory Visit: Payer: Self-pay | Admitting: Cardiovascular Disease

## 2022-10-30 DIAGNOSIS — I480 Paroxysmal atrial fibrillation: Secondary | ICD-10-CM

## 2022-10-30 NOTE — Progress Notes (Signed)
    Communicated with the patient who had another episode of atrial fibrillation.  This time it was documented in the [facility] emergency room and I have reviewed that EKG.  He is otherwise doing well and has no symptoms at present.  He is taking apixaban for anticoagulation.  I recommended that we refer him to the atrial fibrillation clinic to determine best treatment course with either antiarrhythmic drug therapy or EP referral for consideration of atrial fib ablation.  He would like to pursue referral to the atrial fibrillation clinic.   Jeffery Evans 10/28/2022 4:33 PM     Referral placed to A-fib clinic at this time.

## 2022-10-30 NOTE — Progress Notes (Signed)
Referral placed to A-fib clinic.

## 2022-11-11 ENCOUNTER — Encounter: Payer: Self-pay | Admitting: Cardiology

## 2022-11-11 ENCOUNTER — Ambulatory Visit: Payer: BC Managed Care – PPO | Attending: Cardiology | Admitting: Cardiology

## 2022-11-11 VITALS — BP 140/82 | HR 64 | Ht 71.0 in | Wt 235.0 lb

## 2022-11-11 DIAGNOSIS — I480 Paroxysmal atrial fibrillation: Secondary | ICD-10-CM

## 2022-11-11 DIAGNOSIS — I10 Essential (primary) hypertension: Secondary | ICD-10-CM

## 2022-11-11 MED ORDER — NEBIVOLOL HCL 10 MG PO TABS: 10.0000 mg | ORAL_TABLET | Freq: Every day | ORAL | 3 refills | Status: DC

## 2022-11-11 NOTE — Addendum Note (Signed)
Addended by: Bernestine Amass on: 11/11/2022 04:29 PM   Modules accepted: Orders

## 2022-11-11 NOTE — Patient Instructions (Addendum)
Medication Instructions:  Your physician recommends that you continue on your current medications as directed. Please refer to the Current Medication list given to you today.  *If you need a refill on your cardiac medications before your next appointment, please call your pharmacy*  Lab Work: BMET and CBC about two weeks prior to your ablation. Please let us know when you would like to have these drawn.   Testing/Procedures: Your physician has requested that you have cardiac CT. Cardiac computed tomography (CT) is a painless test that uses an x-ray machine to take clear, detailed pictures of your heart. For further information please visit [email or web address]. Please follow instruction sheet as given. We will call you to schedule you CT scan. This will be done about a week prior to your ablation.   Your physician has recommended that you have an ablation. Catheter ablation is a medical procedure used to treat some cardiac arrhythmias (irregular heartbeats). During catheter ablation, a long, thin, flexible tube is put into a blood vessel in your groin (upper thigh), or neck. This tube is called an ablation catheter. It is then guided to your heart through the blood vessel. Radio frequency waves destroy small areas of heart tissue where abnormal heartbeats may cause an arrhythmia to start. Please see the instruction sheet given to you today. You are scheduled for Atrial Fibrillation Ablation on Monday, July 29 with Dr. Lars Mage at 7:30am. Please arrive at the Main Entrance A at [facility]: [place], [place] [id number] at 5:30 AM    Follow-Up: At [facility], you and your health needs are our priority.  As part of our continuing mission to provide you with exceptional heart care, we have created designated Provider Care Teams.  These Care Teams include your primary Cardiologist (physician) and Advanced Practice Providers (APPs -  Physician Assistants and Nurse  Practitioners) who all work together to provide you with the care you need, when you need it.  Your next appointment:   We will call you to arrange your follow up appointments

## 2022-11-11 NOTE — Progress Notes (Signed)
Electrophysiology Office Note:    Date:  11/11/2022   ID:  Jeffery Evans, DOB Jul 25, 1956, MRN [id number]  [facility] Cardiologist:  Sherren Mocha, MD  [facility] Electrophysiologist:  Vickie Epley, MD   Referring MD: Prince Solian, MD   Chief Complaint: Atrial fibrillation  History of Present Illness:    Jeffery Evans is a 67 y.o. male who I am seeing today for an evaluation of paroxysmal atrial fibrillation at the request of Dr. Burt Knack.  He takes Eliquis for stroke prophylaxis.  He also has a history of hypertension and coronary artery disease.  His atrial fibrillation has been relatively asymptomatic.  Today, he reports his most recent episode of atrial fibrillation was 2 weeks ago when he was in the ED at [facility]. That episode was his longest at 11 hours. Since September, he has noticed 5-6 episodes of atrial fibrillation. When he is out of rhythm, he generally feels "a little jittery." He denies having any other associated symptoms.  In recent months he has been more aware of having PVCs. Sometimes he detects such arrhythmias when running EKG tracings with his smart watch. He had never felt his palpitations until recently.  For a time he had discontinued Eliquis. This was restarted when his Afib recurred. He has remained on Eliquis since and denies any missed doses. He complains of easy bleeding with minor lacerations.  In clinic his BP is elevated at 140/82. Over the last 6 months he has seen gradually increasing blood pressures. Prior to that, his blood pressure was well controlled averaging 110s/60s-70s. He was started on Bystolic 5 mg daily.  He denies any chest pain, shortness of breath, or peripheral edema. No lightheadedness, headaches, syncope, orthopnea, or PND.     Their past medical, social and family history was reveiwed.   ROS:   Please see the history of present illness.    (+) "Jitteriness" with Afib (+) Easy bleeding (+) Palpitations All  other systems reviewed and are negative.  EKGs/Labs/Other Studies Reviewed:    The following studies were reviewed today:  July 10, 2022 echo EF 55 to 60% RV normal No significant valvular abnormalities  June 24, 2022 ZIO monitor No atrial fibrillation during monitoring.  October 28, 2022 EKG is atrial fibrillation with a ventricular rate of 94 bpm.  Left anterior fascicular block.   Physical Exam:    VS:  BP (!) 140/82   Pulse 64   Ht '5\' 11"'$  (1.803 m)   Wt 235 lb (106.6 kg)   SpO2 98%   BMI 32.78 kg/m     Wt Readings from Last 3 Encounters:  11/11/22 235 lb (106.6 kg)  10/25/22 228 lb (103.4 kg)  05/29/22 238 lb 9.6 oz (108.2 kg)     GEN: Well nourished, well developed in no acute distress CARDIAC: RRR, no murmurs, rubs, gallops RESPIRATORY:  Clear to auscultation without rales, wheezing or rhonchi       ASSESSMENT AND PLAN:    1. PAF (paroxysmal atrial fibrillation) ([place])     #Paroxysmal atrial fibrillation Relatively asymptomatic although he is aware of his arrhythmia.  He is very interested in achieving rhythm control sooner rather than later because he fears the downstream consequences of more persistent atrial fibrillation.  He has already noticed an increase in the frequency and duration of A-fib episodes.  On Eliquis for stroke prophylaxis.  Monitors his rhythm using an Apple Watch.  We do have an EKG from the emergency department in February demonstrating atrial fibrillation.  Discussed treatment options with Dr. Royce Macadamia today including continuing with a watchful waiting/conservative approach, rhythm control using catheter ablation and rhythm control using antiarrhythmics.  -----  Discussed treatment options today for their AF including antiarrhythmic drug therapy and ablation. Discussed risks, recovery and likelihood of success. Discussed potential need for repeat ablation procedures and antiarrhythmic drugs after an initial ablation. They wish to  proceed with scheduling.  Risk, benefits, and alternatives to EP study and radiofrequency ablation for afib were also discussed in detail today. These risks include but are not limited to stroke, bleeding, vascular damage, tamponade, perforation, damage to the esophagus, lungs, and other structures, pulmonary vein stenosis, worsening renal function, and death. The patient understands these risk and wishes to proceed.  We will therefore proceed with catheter ablation at the next available time.  Carto, ICE, anesthesia are requested for the procedure.  Will also obtain CT PV protocol prior to the procedure to exclude LAA thrombus and further evaluate atrial anatomy.  #Coronary artery disease Follows with Dr. Burt Knack.  No ischemic symptoms today.  #Hypertension At upper limit of normal today. He has been running higher recently. I will increase his Bystolic to '10mg'$  PO daily today and he will monitor at home. Recommend checking blood pressures 1-2 times per week at home and recording the values.  Recommend bringing these recordings to the primary care physician.   I,Mathew Stumpf,acting as a Education administrator for Vickie Epley, MD.,have documented all relevant documentation on the behalf of Vickie Epley, MD,as directed by  Vickie Epley, MD while in the presence of Vickie Epley, MD.  I, Vickie Epley, MD, have reviewed all documentation for this visit. The documentation on 11/11/22 for the exam, diagnosis, procedures, and orders are all accurate and complete.   Signed, Hilton Cork. Quentin Ore, MD, [facility], [facility] 11/11/2022 2:06 PM    Electrophysiology [facility]

## 2022-12-03 DIAGNOSIS — L821 Other seborrheic keratosis: Secondary | ICD-10-CM | POA: Diagnosis not present

## 2022-12-03 DIAGNOSIS — C44519 Basal cell carcinoma of skin of other part of trunk: Secondary | ICD-10-CM | POA: Diagnosis not present

## 2022-12-03 DIAGNOSIS — L814 Other melanin hyperpigmentation: Secondary | ICD-10-CM | POA: Diagnosis not present

## 2022-12-03 DIAGNOSIS — L578 Other skin changes due to chronic exposure to nonionizing radiation: Secondary | ICD-10-CM | POA: Diagnosis not present

## 2022-12-24 DIAGNOSIS — D485 Neoplasm of uncertain behavior of skin: Secondary | ICD-10-CM | POA: Diagnosis not present

## 2022-12-24 DIAGNOSIS — L738 Other specified follicular disorders: Secondary | ICD-10-CM | POA: Diagnosis not present

## 2022-12-24 DIAGNOSIS — C44619 Basal cell carcinoma of skin of left upper limb, including shoulder: Secondary | ICD-10-CM | POA: Diagnosis not present

## 2023-02-10 ENCOUNTER — Telehealth: Payer: Self-pay | Admitting: Cardiology

## 2023-02-10 NOTE — Telephone Encounter (Signed)
Pt would like a callback to discuss Ablation procedure and to see if his insurance has approved it yet. Please advise

## 2023-02-10 NOTE — Telephone Encounter (Signed)
Spoke with the patient and advised that I am not sure whether or not insurance has approved his ablation yet. I let him know that our pre-cert has all the information and is working on it. We will let him know if there are any issues.

## 2023-02-12 ENCOUNTER — Other Ambulatory Visit: Payer: Self-pay

## 2023-02-12 DIAGNOSIS — I480 Paroxysmal atrial fibrillation: Secondary | ICD-10-CM

## 2023-03-24 ENCOUNTER — Other Ambulatory Visit (HOSPITAL_COMMUNITY): Payer: BC Managed Care – PPO

## 2023-03-24 ENCOUNTER — Other Ambulatory Visit: Payer: Self-pay

## 2023-03-24 DIAGNOSIS — I480 Paroxysmal atrial fibrillation: Secondary | ICD-10-CM

## 2023-03-27 ENCOUNTER — Encounter (HOSPITAL_COMMUNITY)
Admission: RE | Admit: 2023-03-27 | Discharge: 2023-03-27 | Disposition: A | Discharge status: Home / Self Care | Payer: BC Managed Care – PPO | Source: Ambulatory Visit | Attending: Cardiology | Admitting: Cardiology

## 2023-03-27 DIAGNOSIS — I480 Paroxysmal atrial fibrillation: Secondary | ICD-10-CM | POA: Diagnosis not present

## 2023-03-27 DIAGNOSIS — Z01812 Encounter for preprocedural laboratory examination: Secondary | ICD-10-CM | POA: Insufficient documentation

## 2023-03-27 DIAGNOSIS — Z01818 Encounter for other preprocedural examination: Secondary | ICD-10-CM

## 2023-03-27 LAB — CBC WITH DIFFERENTIAL/PLATELET
Abs Immature Granulocytes: 0.04 10*3/uL (ref 0.00–0.07)
Basophils Absolute: 0 10*3/uL (ref 0.0–0.1)
Basophils Relative: 0 %
Eosinophils Absolute: 0.2 10*3/uL (ref 0.0–0.5)
Eosinophils Relative: 3 %
HCT: 48.2 % (ref 39.0–52.0)
Hemoglobin: 16.3 g/dL (ref 13.0–17.0)
Immature Granulocytes: 1 %
Lymphocytes Relative: 29 %
Lymphs Abs: 2.2 10*3/uL (ref 0.7–4.0)
MCH: 30.9 pg (ref 26.0–34.0)
MCHC: 33.8 g/dL (ref 30.0–36.0)
MCV: 91.3 fL (ref 80.0–100.0)
Monocytes Absolute: 0.6 10*3/uL (ref 0.1–1.0)
Monocytes Relative: 8 %
Neutro Abs: 4.5 10*3/uL (ref 1.7–7.7)
Neutrophils Relative %: 59 %
Platelets: 242 10*3/uL (ref 150–400)
RBC: 5.28 MIL/uL (ref 4.22–5.81)
RDW: 12 % (ref 11.5–15.5)
WBC: 7.5 10*3/uL (ref 4.0–10.5)
nRBC: 0 % (ref 0.0–0.2)

## 2023-03-27 LAB — BASIC METABOLIC PANEL
Anion gap: 7 (ref 5–15)
BUN: 16 mg/dL (ref 8–23)
CO2: 27 mmol/L (ref 22–32)
Calcium: 9.5 mg/dL (ref 8.9–10.3)
Chloride: 107 mmol/L (ref 98–111)
Creatinine, Ser: 1.01 mg/dL (ref 0.61–1.24)
GFR, Estimated: >60 mL/min (ref >60)
Glucose, Bld: 104 mg/dL — ABNORMAL HIGH (ref 70–99)
Potassium: 4.1 mmol/L (ref 3.5–5.1)
Sodium: 141 mmol/L (ref 135–145)

## 2023-03-30 ENCOUNTER — Telehealth (HOSPITAL_COMMUNITY): Payer: Self-pay | Admitting: *Deleted

## 2023-03-30 ENCOUNTER — Other Ambulatory Visit (HOSPITAL_COMMUNITY): Payer: BC Managed Care – PPO

## 2023-03-30 NOTE — Telephone Encounter (Signed)
Reaching out to patient to offer assistance regarding upcoming cardiac imaging study; pt verbalizes understanding of appt date/time, parking situation and where to check in, pre-test NPO status, and verified current allergies; name and call back number provided for further questions should they arise  Larey Brick RN Navigator Cardiac Imaging [facility] [phone] office [phone] cell  Patient aware to arrive at 7:30 AM.

## 2023-03-31 ENCOUNTER — Ambulatory Visit (HOSPITAL_COMMUNITY)
Admission: RE | Admit: 2023-03-31 | Discharge: 2023-03-31 | Disposition: A | Discharge status: Home / Self Care | Payer: BC Managed Care – PPO | Source: Ambulatory Visit | Attending: Cardiology | Admitting: Cardiology

## 2023-03-31 ENCOUNTER — Inpatient Hospital Stay (HOSPITAL_COMMUNITY): Admission: RE | Admit: 2023-03-31 | Payer: BC Managed Care – PPO | Source: Ambulatory Visit

## 2023-03-31 DIAGNOSIS — I480 Paroxysmal atrial fibrillation: Secondary | ICD-10-CM | POA: Diagnosis not present

## 2023-03-31 MED ORDER — IOHEXOL 350 MG/ML SOLN
95.0000 mL | Freq: Once | INTRAVENOUS | Status: AC | PRN
  Administered 2023-03-31: 95 mL via INTRAVENOUS

## 2023-04-05 NOTE — Pre-Procedure Instructions (Signed)
Attempted to call patient regarding procedure instructions.  Left voicemail on the following items: Arrival time 515 Nothing to eat or drink after midnight No meds AM of procedure Responsible person to drive you home and stay with you for 24 hrs  Have you missed any doses of anti-coagulant Eliquis- should be taken it twice a day, if you have missed any doses please let us know.  Don't take dose in the morning.

## 2023-04-06 ENCOUNTER — Other Ambulatory Visit (HOSPITAL_COMMUNITY): Payer: Self-pay

## 2023-04-06 ENCOUNTER — Ambulatory Visit (HOSPITAL_COMMUNITY): Payer: BC Managed Care – PPO | Admitting: Anesthesiology

## 2023-04-06 ENCOUNTER — Ambulatory Visit (HOSPITAL_COMMUNITY)
Admission: RE | Admit: 2023-04-06 | Discharge: 2023-04-06 | Disposition: A | Discharge status: Home / Self Care | Payer: BC Managed Care – PPO | Source: Ambulatory Visit | Attending: Cardiology | Admitting: Cardiology

## 2023-04-06 ENCOUNTER — Other Ambulatory Visit: Payer: Self-pay

## 2023-04-06 ENCOUNTER — Encounter (HOSPITAL_COMMUNITY)
Admission: RE | Disposition: A | Discharge status: Home / Self Care | Payer: Self-pay | Source: Ambulatory Visit | Attending: Cardiology

## 2023-04-06 DIAGNOSIS — Z7901 Long term (current) use of anticoagulants: Secondary | ICD-10-CM | POA: Diagnosis not present

## 2023-04-06 DIAGNOSIS — I480 Paroxysmal atrial fibrillation: Secondary | ICD-10-CM | POA: Insufficient documentation

## 2023-04-06 DIAGNOSIS — Z87891 Personal history of nicotine dependence: Secondary | ICD-10-CM | POA: Diagnosis not present

## 2023-04-06 DIAGNOSIS — I10 Essential (primary) hypertension: Secondary | ICD-10-CM | POA: Diagnosis not present

## 2023-04-06 DIAGNOSIS — E782 Mixed hyperlipidemia: Secondary | ICD-10-CM | POA: Diagnosis not present

## 2023-04-06 DIAGNOSIS — I4891 Unspecified atrial fibrillation: Secondary | ICD-10-CM

## 2023-04-06 HISTORY — PX: ATRIAL FIBRILLATION ABLATION: EP1191

## 2023-04-06 LAB — POCT ACTIVATED CLOTTING TIME
Activated Clotting Time: 287 s
Activated Clotting Time: 342 seconds

## 2023-04-06 SURGERY — ATRIAL FIBRILLATION ABLATION
Anesthesia: General

## 2023-04-06 MED ORDER — SUGAMMADEX SODIUM 200 MG/2ML IV SOLN
INTRAVENOUS | Status: DC | PRN
  Administered 2023-04-06: 200 mg via INTRAVENOUS

## 2023-04-06 MED ORDER — ROCURONIUM BROMIDE 10 MG/ML (PF) SYRINGE
PREFILLED_SYRINGE | INTRAVENOUS | Status: DC | PRN
  Administered 2023-04-06: 20 mg via INTRAVENOUS
  Administered 2023-04-06: 70 mg via INTRAVENOUS

## 2023-04-06 MED ORDER — APIXABAN 5 MG PO TABS
5.0000 mg | ORAL_TABLET | Freq: Two times a day (BID) | ORAL | Status: DC
  Administered 2023-04-06: 5 mg via ORAL
  Filled 2023-04-06: qty 1

## 2023-04-06 MED ORDER — PROTAMINE SULFATE 10 MG/ML IV SOLN
INTRAVENOUS | Status: DC | PRN
  Administered 2023-04-06: 35 mg via INTRAVENOUS

## 2023-04-06 MED ORDER — HEPARIN SODIUM (PORCINE) 1000 UNIT/ML IJ SOLN
INTRAMUSCULAR | Status: DC | PRN
  Administered 2023-04-06: 1000 [IU] via INTRAVENOUS

## 2023-04-06 MED ORDER — PHENYLEPHRINE HCL-NACL 20-0.9 MG/250ML-% IV SOLN
INTRAVENOUS | Status: DC | PRN
  Administered 2023-04-06: 40 ug/min via INTRAVENOUS

## 2023-04-06 MED ORDER — HEPARIN (PORCINE) IN NACL 1000-0.9 UT/500ML-% IV SOLN
INTRAVENOUS | Status: DC | PRN
  Administered 2023-04-06 (×3): 500 mL

## 2023-04-06 MED ORDER — HEPARIN SODIUM (PORCINE) 1000 UNIT/ML IJ SOLN
INTRAMUSCULAR | Status: AC
  Filled 2023-04-06: qty 10

## 2023-04-06 MED ORDER — PANTOPRAZOLE SODIUM 40 MG PO TBEC
40.0000 mg | DELAYED_RELEASE_TABLET | Freq: Every day | ORAL | 0 refills | Status: DC
Start: 2023-04-06 — End: 2023-09-17
  Filled 2023-04-06: qty 45, 45d supply, fill #0

## 2023-04-06 MED ORDER — SODIUM CHLORIDE 0.9% FLUSH: 3.0000 mL | INTRAVENOUS | Status: DC | PRN

## 2023-04-06 MED ORDER — HEPARIN SODIUM (PORCINE) 1000 UNIT/ML IJ SOLN
INTRAMUSCULAR | Status: DC | PRN
  Administered 2023-04-06: 16000 [IU] via INTRAVENOUS
  Administered 2023-04-06: 5000 [IU] via INTRAVENOUS

## 2023-04-06 MED ORDER — PROPOFOL 10 MG/ML IV BOLUS
INTRAVENOUS | Status: DC | PRN
Start: 2023-04-06 — End: 2023-04-06
  Administered 2023-04-06: 40 mg via INTRAVENOUS
  Administered 2023-04-06: 160 mg via INTRAVENOUS

## 2023-04-06 MED ORDER — ACETAMINOPHEN 325 MG PO TABS: 650.0000 mg | ORAL_TABLET | ORAL | Status: DC | PRN

## 2023-04-06 MED ORDER — ATROPINE SULFATE 0.4 MG/ML IV SOLN
INTRAVENOUS | Status: DC | PRN
  Administered 2023-04-06: .5 mg via INTRAVENOUS

## 2023-04-06 MED ORDER — PROPOFOL 500 MG/50ML IV EMUL
INTRAVENOUS | Status: DC | PRN
  Administered 2023-04-06: 125 ug/kg/min via INTRAVENOUS

## 2023-04-06 MED ORDER — SODIUM CHLORIDE 0.9 % IV SOLN: INTRAVENOUS | Status: DC

## 2023-04-06 MED ORDER — SODIUM CHLORIDE 0.9% FLUSH: 3.0000 mL | Freq: Two times a day (BID) | INTRAVENOUS | Status: DC

## 2023-04-06 MED ORDER — ONDANSETRON HCL 4 MG/2ML IJ SOLN: 4.0000 mg | Freq: Four times a day (QID) | INTRAMUSCULAR | Status: DC | PRN

## 2023-04-06 MED ORDER — COLCHICINE 0.6 MG PO TABS
0.6000 mg | ORAL_TABLET | Freq: Two times a day (BID) | ORAL | Status: DC
  Administered 2023-04-06: 0.6 mg via ORAL
  Filled 2023-04-06: qty 1

## 2023-04-06 MED ORDER — FENTANYL CITRATE (PF) 250 MCG/5ML IJ SOLN
INTRAMUSCULAR | Status: DC | PRN
  Administered 2023-04-06: 100 ug via INTRAVENOUS

## 2023-04-06 MED ORDER — PANTOPRAZOLE SODIUM 40 MG PO TBEC
40.0000 mg | DELAYED_RELEASE_TABLET | Freq: Every day | ORAL | Status: DC
  Administered 2023-04-06: 40 mg via ORAL
  Filled 2023-04-06: qty 1

## 2023-04-06 MED ORDER — COLCHICINE 0.6 MG PO TABS
0.6000 mg | ORAL_TABLET | Freq: Two times a day (BID) | ORAL | 0 refills | Status: DC
  Filled 2023-04-06: qty 10, 5d supply, fill #0

## 2023-04-06 MED ORDER — DEXAMETHASONE SODIUM PHOSPHATE 10 MG/ML IJ SOLN
INTRAMUSCULAR | Status: DC | PRN
  Administered 2023-04-06: 10 mg via INTRAVENOUS

## 2023-04-06 MED ORDER — FENTANYL CITRATE (PF) 100 MCG/2ML IJ SOLN: 25.0000 ug | INTRAMUSCULAR | Status: DC | PRN

## 2023-04-06 MED ORDER — SODIUM CHLORIDE 0.9 % IV SOLN: 250.0000 mL | INTRAVENOUS | Status: DC | PRN

## 2023-04-06 MED ORDER — ONDANSETRON HCL 4 MG/2ML IJ SOLN
INTRAMUSCULAR | Status: DC | PRN
  Administered 2023-04-06: 4 mg via INTRAVENOUS

## 2023-04-06 MED ORDER — OXYCODONE HCL 5 MG PO TABS: 5.0000 mg | ORAL_TABLET | Freq: Once | ORAL | Status: DC | PRN

## 2023-04-06 MED ORDER — PHENYLEPHRINE 80 MCG/ML (10ML) SYRINGE FOR IV PUSH (FOR BLOOD PRESSURE SUPPORT)
PREFILLED_SYRINGE | INTRAVENOUS | Status: DC | PRN
  Administered 2023-04-06: 160 ug via INTRAVENOUS

## 2023-04-06 MED ORDER — OXYCODONE HCL 5 MG/5ML PO SOLN: 5.0000 mg | Freq: Once | ORAL | Status: DC | PRN

## 2023-04-06 MED ORDER — LIDOCAINE 2% (20 MG/ML) 5 ML SYRINGE
INTRAMUSCULAR | Status: DC | PRN
  Administered 2023-04-06: 60 mg via INTRAVENOUS

## 2023-04-06 SURGICAL SUPPLY — 21 items
BAG SNAP BAND KOVER 36X36 (MISCELLANEOUS) IMPLANT
BLANKET WARM UNDERBOD FULL ACC (MISCELLANEOUS) ×1 IMPLANT
CATH 8FR REPROCESSED SOUNDSTAR (CATHETERS) ×1 IMPLANT
CATH 8FR SOUNDSTAR REPROCESSED (CATHETERS) IMPLANT
CATH ABLAT QDOT MICRO BI TC DF (CATHETERS) IMPLANT
CATH OCTARAY 2.0 F 3-3-3-3-3 (CATHETERS) IMPLANT
CATH S-M CIRCA TEMP PROBE (CATHETERS) IMPLANT
CATH WEB BI DIR CSDF CRV REPRO (CATHETERS) IMPLANT
CLOSURE PERCLOSE PROSTYLE (VASCULAR PRODUCTS) IMPLANT
COVER SWIFTLINK CONNECTOR (BAG) ×1 IMPLANT
MAT PREVALON FULL STRYKER (MISCELLANEOUS) IMPLANT
PACK EP LATEX FREE (CUSTOM PROCEDURE TRAY) ×1
PACK EP LF (CUSTOM PROCEDURE TRAY) ×1 IMPLANT
PAD DEFIB RADIO PHYSIO CONN (PAD) ×1 IMPLANT
PATCH CARTO3 (PAD) IMPLANT
SHEATH BAYLIS TRANSSEPTAL 98CM (NEEDLE) IMPLANT
SHEATH CARTO VIZIGO SM CVD (SHEATH) IMPLANT
SHEATH PINNACLE 8F 10CM (SHEATH) IMPLANT
SHEATH PINNACLE 9F 10CM (SHEATH) IMPLANT
SHEATH PROBE COVER 6X72 (BAG) IMPLANT
TUBING SMART ABLATE COOLFLOW (TUBING) IMPLANT

## 2023-04-06 NOTE — Anesthesia Procedure Notes (Signed)
Procedure Name: Intubation Date/Time: 04/06/2023 7:36 AM  Performed by: Quentin Ore, CRNAPre-anesthesia Checklist: Patient identified, Emergency Drugs available, Suction available and Patient being monitored Patient Re-evaluated:Patient Re-evaluated prior to induction Oxygen Delivery Method: Circle system utilized Preoxygenation: Pre-oxygenation with 100% oxygen Induction Type: IV induction Ventilation: Mask ventilation without difficulty and Oral airway inserted - appropriate to patient size Laryngoscope Size: Glidescope and 4 Tube type: Oral Tube size: 7.5 mm Number of attempts: 1 Airway Equipment and Method: Stylet and Video-laryngoscopy Placement Confirmation: ETT inserted through vocal cords under direct vision, positive ETCO2 and breath sounds checked- equal and bilateral Secured at: 22 cm Tube secured with: Tape Dental Injury: Teeth and Oropharynx as per pre-operative assessment  Comments: Electively used Glidescope. Grade I view on screen. +ETCO2 & BBS=.

## 2023-04-06 NOTE — Anesthesia Postprocedure Evaluation (Signed)
Anesthesia Post Note  Patient: Solace Galetti  Procedure(s) Performed: ATRIAL FIBRILLATION ABLATION     Patient location during evaluation: PACU Anesthesia Type: General Level of consciousness: awake and alert Pain management: pain level controlled Vital Signs Assessment: post-procedure vital signs reviewed and stable Respiratory status: spontaneous breathing, nonlabored ventilation, respiratory function stable and patient connected to nasal cannula oxygen Cardiovascular status: blood pressure returned to baseline and stable Postop Assessment: no apparent nausea or vomiting Anesthetic complications: no   No notable events documented.  Last Vitals:  Vitals:   04/06/23 1230 04/06/23 1300  BP: 103/64 111/66  Pulse: (!) 59 (!) 57  Resp: 17 15  Temp:    SpO2: 94% 95%    Last Pain:  Vitals:   04/06/23 1051  TempSrc:   PainSc: 1                  Ahmyah Gidley P Elisa Kutner

## 2023-04-06 NOTE — Progress Notes (Signed)
Pt ambulated to and from bathroom to void with no signs of oozing from bilateral groin sites  

## 2023-04-06 NOTE — Transfer of Care (Signed)
Immediate Anesthesia Transfer of Care Note  Patient: Jeffery Evans  Procedure(s) Performed: ATRIAL FIBRILLATION ABLATION  Patient Location: Cath Lab  Anesthesia Type:General  Level of Consciousness: awake, alert , and oriented  Airway & Oxygen Therapy: Patient Spontanous Breathing and Patient connected to nasal cannula oxygen  Post-op Assessment: Report given to RN and Post -op Vital signs reviewed and stable  Post vital signs: Reviewed and stable  Last Vitals:  Vitals Value Taken Time  BP 96/47 04/06/23 1015  Temp 36.9 C 04/06/23 1013  Pulse 68 04/06/23 1017  Resp 0 04/06/23 1017  SpO2 96 % 04/06/23 1017  Vitals shown include unfiled device data.  Last Pain:  Vitals:   04/06/23 1013  TempSrc: Tympanic  PainSc: 0-No pain         Complications: No notable events documented.

## 2023-04-06 NOTE — Anesthesia Preprocedure Evaluation (Addendum)
Anesthesia Evaluation  Patient identified by MRN, date of birth, ID band Patient awake    Reviewed: Allergy & Precautions, H&P , NPO status , Patient's Chart, lab work & pertinent test results  Airway Mallampati: II   Neck ROM: full    Dental   Pulmonary former smoker   breath sounds clear to auscultation       Cardiovascular hypertension, Pt. on medications and Pt. on home beta blockers + dysrhythmias Atrial Fibrillation  Rhythm:regular Rate:Normal  ECHO:  1. Left ventricular ejection fraction, by estimation, is 55 to 60%. The left ventricle has normal function. The left ventricle has no regional wall motion abnormalities. Left ventricular diastolic parameters are consistent with Grade I diastolic dysfunction (impaired relaxation). The average left ventricular global longitudinal strain is -24.8 %. The global longitudinal strain is normal.  2. Right ventricular systolic function is normal. The right ventricular size is mildly enlarged. Tricuspid regurgitation signal is inadequate for assessing PA pressure.  3. The mitral valve is normal in structure. Trivial mitral valve regurgitation. No evidence of mitral stenosis.  4. The aortic valve is tricuspid. Aortic valve regurgitation is not visualized. No aortic stenosis is present.  5. The inferior vena cava is normal in size with greater than 50% respiratory variability, suggesting right atrial pressure of 3 mmHg.    Neuro/Psych negative neurological ROS  negative psych ROS   GI/Hepatic Neg liver ROS,GERD  Medicated,,  Endo/Other  negative endocrine ROS    Renal/GU stones  negative genitourinary   Musculoskeletal negative musculoskeletal ROS (+)    Abdominal   Peds  Hematology Lab Results      Component                Value               Date                      WBC                      7.5                 03/27/2023                HGB                      16.3                 03/27/2023                HCT                      48.2                03/27/2023                MCV                      91.3                03/27/2023                PLT                      242                 03/27/2023  Lab Results      Component                Value               Date                      NA                       141                 03/27/2023                K                        4.1                 03/27/2023                CO2                      27                  03/27/2023                GLUCOSE                  104 (H)             03/27/2023                BUN                      16                  03/27/2023                CREATININE               1.01                03/27/2023                CALCIUM                  9.5                 03/27/2023                GFRNONAA                 >60                 03/27/2023              Anesthesia Other Findings   Reproductive/Obstetrics                             Anesthesia Physical Anesthesia Plan  ASA: 3  Anesthesia Plan: General   Post-op Pain Management:    Induction: Intravenous  PONV Risk Score and Plan: 2 and Ondansetron, Dexamethasone, Treatment may vary due to age or medical condition, TIVA, Aprepitant and Propofol infusion  Airway Management Planned: Oral ETT, Mask and Video Laryngoscope Planned  Additional Equipment: None  Intra-op Plan:   Post-operative Plan: Extubation in OR  Informed Consent: I have reviewed the patients History and Physical, chart, labs and discussed the procedure  including the risks, benefits and alternatives for the proposed anesthesia with the patient or authorized representative who has indicated his/her understanding and acceptance.     Dental advisory given  Plan Discussed with: CRNA, Anesthesiologist and Surgeon  Anesthesia Plan Comments:        Anesthesia Quick Evaluation

## 2023-04-06 NOTE — Discharge Instructions (Signed)

## 2023-04-06 NOTE — H&P (Signed)
Electrophysiology Office Note:     Date:  04/06/2023    ID:  Reuben Staude, DOB 05-03-1956, MRN [id number]   [facility] Cardiologist:  Tonny Bollman, MD  [facility] Electrophysiologist:  Lanier Prude, MD    Referring MD: Chilton Greathouse, MD    Chief Complaint: Atrial fibrillation   History of Present Illness:     Jeffery Evans is a 67 y.o. male who I am seeing today for an evaluation of paroxysmal atrial fibrillation at the request of Dr. Excell Seltzer.  He takes Eliquis for stroke prophylaxis.  He also has a history of hypertension and coronary artery disease.  His atrial fibrillation has been relatively asymptomatic.   Today, he reports his most recent episode of atrial fibrillation was 2 weeks ago when he was in the ED at [facility]. That episode was his longest at 11 hours. Since September, he has noticed 5-6 episodes of atrial fibrillation. When he is out of rhythm, he generally feels "a little jittery." He denies having any other associated symptoms.   In recent months he has been more aware of having PVCs. Sometimes he detects such arrhythmias when running EKG tracings with his smart watch. He had never felt his palpitations until recently.   For a time he had discontinued Eliquis. This was restarted when his Afib recurred. He has remained on Eliquis since and denies any missed doses. He complains of easy bleeding with minor lacerations.   In clinic his BP is elevated at 140/82. Over the last 6 months he has seen gradually increasing blood pressures. Prior to that, his blood pressure was well controlled averaging 110s/60s-70s. He was started on Bystolic 5 mg daily.   He denies any chest pain, shortness of breath, or peripheral edema. No lightheadedness, headaches, syncope, orthopnea, or PND.  Presents for PVI today.     Objective Their past medical, social and family history was reveiwed.     ROS:   Please see the history of present illness.    (+) "Jitteriness"  with Afib (+) Easy bleeding (+) Palpitations All other systems reviewed and are negative.   EKGs/Labs/Other Studies Reviewed:     The following studies were reviewed today:   July 10, 2022 echo EF 55 to 60% RV normal No significant valvular abnormalities   June 24, 2022 ZIO monitor No atrial fibrillation during monitoring.   October 28, 2022 EKG is atrial fibrillation with a ventricular rate of 94 bpm.  Left anterior fascicular block.     Physical Exam:     VS:  BP 131/62   Pulse 62   Ht 5\' 11"  (1.803 m)   Wt 235 lb (106.6 kg)   SpO2 98%   BMI 32.78 kg/m         Wt Readings from Last 3 Encounters:  11/11/22 235 lb (106.6 kg)  10/25/22 228 lb (103.4 kg)  05/29/22 238 lb 9.6 oz (108.2 kg)      GEN: Well nourished, well developed in no acute distress CARDIAC: RRR, no murmurs, rubs, gallops RESPIRATORY:  Clear to auscultation without rales, wheezing or rhonchi          Assessment ASSESSMENT AND PLAN:     1. PAF (paroxysmal atrial fibrillation) (HCC)       #Paroxysmal atrial fibrillation Relatively asymptomatic although he is aware of his arrhythmia.  He is very interested in achieving rhythm control sooner rather than later because he fears the downstream consequences of more persistent atrial fibrillation.  He has already noticed an  increase in the frequency and duration of A-fib episodes.  On Eliquis for stroke prophylaxis.  Monitors his rhythm using an Apple Watch.  We do have an EKG from the emergency department in February demonstrating atrial fibrillation.   Discussed treatment options with Dr. Malen Gauze today including continuing with a watchful waiting/conservative approach, rhythm control using catheter ablation and rhythm control using antiarrhythmics.   -----   Discussed treatment options today for their AF including antiarrhythmic drug therapy and ablation. Discussed risks, recovery and likelihood of success. Discussed potential need for repeat  ablation procedures and antiarrhythmic drugs after an initial ablation. They wish to proceed with scheduling.   Risk, benefits, and alternatives to EP study and radiofrequency ablation for afib were also discussed in detail today. These risks include but are not limited to stroke, bleeding, vascular damage, tamponade, perforation, damage to the esophagus, lungs, and other structures, pulmonary vein stenosis, worsening renal function, and death. The patient understands these risk and wishes to proceed.  We will therefore proceed with catheter ablation at the next available time.  Carto, ICE, anesthesia are requested for the procedure.  Will also obtain CT PV protocol prior to the procedure to exclude LAA thrombus and further evaluate atrial anatomy.   Presents for PVI today. Procedure reviewed.    Signed, Rossie Muskrat. Lalla Brothers, MD, [facility], [facility] 04/06/2023 Electrophysiology [facility]

## 2023-04-07 ENCOUNTER — Encounter (HOSPITAL_COMMUNITY): Payer: Self-pay | Admitting: Cardiology

## 2023-04-09 ENCOUNTER — Telehealth: Payer: Self-pay

## 2023-04-09 NOTE — Telephone Encounter (Signed)
Spoke with Dr. Malen Gauze today on the telephone.  Approximately 48 hours after the procedure he noted back pain radiating to underneath his scapula.  Worsens with deep breathing.  Is not severe but is noticeable.  No shortness of breath.  Does have a cough at times.  No fevers or chills.  No sputum production.  I suspect he is having mild pericarditis following the ablation.  I have encouraged him to continue his colchicine and add ibuprofen 600 mg by mouth twice daily with food for the next 48 hours.  He is to call if his symptoms worsen or change in any way.  Sheria Lang T. Lalla Brothers, MD, [facility] Cardiac Electrophysiology

## 2023-04-09 NOTE — Telephone Encounter (Signed)
Pt called my phone and stated that he had an ablation with Dr. Lalla Brothers on Monday. Since then he has developed a consistent cough and has some back pain. No fever. This has gone on for a few days and not getting any better.  He would like a call back to discuss options on what he should do regarding this.  You can reach him on his cell number listed in his chart.

## 2023-05-04 ENCOUNTER — Ambulatory Visit (HOSPITAL_COMMUNITY): Payer: BC Managed Care – PPO | Admitting: Physician Assistant

## 2023-05-08 ENCOUNTER — Other Ambulatory Visit (HOSPITAL_COMMUNITY): Payer: Self-pay

## 2023-05-08 ENCOUNTER — Ambulatory Visit (HOSPITAL_COMMUNITY)
Admission: RE | Admit: 2023-05-08 | Discharge: 2023-05-08 | Disposition: A | Discharge status: Home / Self Care | Payer: BC Managed Care – PPO | Source: Ambulatory Visit | Attending: Physician Assistant | Admitting: Physician Assistant

## 2023-05-08 ENCOUNTER — Encounter (HOSPITAL_COMMUNITY): Payer: Self-pay | Admitting: Physician Assistant

## 2023-05-08 VITALS — BP 130/78 | HR 72 | Ht 71.0 in | Wt 234.6 lb

## 2023-05-08 DIAGNOSIS — D6869 Other thrombophilia: Secondary | ICD-10-CM | POA: Diagnosis not present

## 2023-05-08 DIAGNOSIS — I2510 Atherosclerotic heart disease of native coronary artery without angina pectoris: Secondary | ICD-10-CM | POA: Diagnosis not present

## 2023-05-08 DIAGNOSIS — I480 Paroxysmal atrial fibrillation: Secondary | ICD-10-CM | POA: Insufficient documentation

## 2023-05-08 DIAGNOSIS — Z7901 Long term (current) use of anticoagulants: Secondary | ICD-10-CM | POA: Diagnosis not present

## 2023-05-08 DIAGNOSIS — R9431 Abnormal electrocardiogram [ECG] [EKG]: Secondary | ICD-10-CM | POA: Insufficient documentation

## 2023-05-08 DIAGNOSIS — R0683 Snoring: Secondary | ICD-10-CM | POA: Insufficient documentation

## 2023-05-08 DIAGNOSIS — I10 Essential (primary) hypertension: Secondary | ICD-10-CM | POA: Insufficient documentation

## 2023-05-08 DIAGNOSIS — I4891 Unspecified atrial fibrillation: Secondary | ICD-10-CM | POA: Diagnosis not present

## 2023-05-08 MED ORDER — DILTIAZEM HCL ER COATED BEADS 240 MG PO CP24: 240.0000 mg | ORAL_CAPSULE | Freq: Every day | ORAL | 5 refills | Status: DC

## 2023-05-08 NOTE — Progress Notes (Signed)
Primary Care Physician: Chilton Greathouse, MD Primary Cardiologist: Tonny Bollman, MD Electrophysiologist: Lanier Prude, MD  Referring Physician: Dr Burnett Corrente is a 67 y.o. male with a history of CAD, HLD, atrial fibrillation who presents for follow up in the [facility].  The patient is s/p afib ablation with Dr Lalla Brothers on 04/06/23. He did have one episode of tachycardia post ablation but otherwise has been maintaining SR. He also had pericarditis post ablation and took ibuprofen and colchicine, now resolved. Patient is on Eliquis for a CHADS2VASC score of 2.  On follow up today, patient denies chest pain, swallowing pain, or groin issues. He has noticed that his BP has been trending upwards over the past year. He has seen 150s systolic at home. He does admit to snoring and witnessed apnea.   Today, he denies symptoms of palpitations, chest pain, shortness of breath, orthopnea, PND, lower extremity edema, dizziness, presyncope, syncope, bleeding, or neurologic sequela. The patient is tolerating medications without difficulties and is otherwise without complaint today.    Atrial Fibrillation Risk Factors:  he does have symptoms or diagnosis of sleep apnea. he is agreeable to sleep study he does not have a history of rheumatic fever. he does not have a history of alcohol use.   Atrial Fibrillation Management history:  Previous antiarrhythmic drugs: none Previous cardioversions: none Previous ablations: 04/06/23 Anticoagulation history: Eliquis  ROS- All systems are reviewed and negative except as per the HPI above.  Past Medical History:  Diagnosis Date   Allergy    Cataract    BEGINNING BILATERAL   Hyperlipidemia    Kidney stones     Current Outpatient Medications  Medication Sig Dispense Refill   apixaban (ELIQUIS) 5 MG TABS tablet Take 1 tablet (5 mg total) by mouth 2 (two) times daily. 180 tablet 3   aspirin EC 81 MG  tablet Take 1 tablet (81 mg total) by mouth daily. Swallow whole. 90 tablet 3   Cholecalciferol (VITAMIN D3) 125 MCG (5000 UT) TABS Take 5,000 Units by mouth daily.     COENZYME Q10 PO Take 1 capsule by mouth daily.     diltiazem (CARTIA XT) 240 MG 24 hr capsule Take 1 capsule (240 mg total) by mouth daily. 30 capsule 5   Evolocumab (REPATHA SURECLICK) 140 MG/ML SOAJ Inject 140 mg into the skin every 14 (fourteen) days. 2 mL 11   fluticasone (FLONASE) 50 MCG/ACT nasal spray Place 1 spray into both nostrils daily as needed for allergies.     loratadine (CLARITIN) 10 MG tablet Take 10 mg by mouth daily.     Multiple Vitamin (MULTIVITAMIN) tablet Take 1 tablet by mouth daily.     Multiple Vitamins-Minerals (PRESERVISION AREDS 2) CAPS Take 1 capsule by mouth daily.     omeprazole (PRILOSEC) 20 MG capsule Take 20 mg by mouth as needed (for heartburn).     pantoprazole (PROTONIX) 40 MG tablet Take 1 tablet (40 mg total) by mouth daily. 45 each 0   colchicine 0.6 MG tablet Take 1 tablet (0.6 mg total) by mouth 2 (two) times daily for 5 days. 10 tablet 0   No current facility-administered medications for this encounter.    Physical Exam: BP 130/78   Pulse 72   Ht 5\' 11"  (1.803 m)   Wt 106.4 kg   BMI 32.72 kg/m   GEN: Well nourished, well developed in no acute distress NECK: No JVD; No carotid bruits CARDIAC: Regular rate  and rhythm, no murmurs, rubs, gallops RESPIRATORY:  Clear to auscultation without rales, wheezing or rhonchi  ABDOMEN: Soft, non-tender, non-distended EXTREMITIES:  No edema; No deformity   Wt Readings from Last 3 Encounters:  05/08/23 106.4 kg  04/06/23 106.1 kg  11/11/22 106.6 kg     EKG today demonstrates  SR, LAFB Vent. rate 72 BPM PR interval 138 ms QRS duration 82 ms QT/QTcB 386/422 ms  Echo 07/10/22 demonstrated   1. Left ventricular ejection fraction, by estimation, is 55 to 60%. The  left ventricle has normal function. The left ventricle has no regional   wall motion abnormalities. Left ventricular diastolic parameters are  consistent with Grade I diastolic dysfunction (impaired relaxation). The average left ventricular global longitudinal strain is -24.8 %. The global longitudinal strain is normal.   2. Right ventricular systolic function is normal. The right ventricular  size is mildly enlarged. Tricuspid regurgitation signal is inadequate for  assessing PA pressure.   3. The mitral valve is normal in structure. Trivial mitral valve  regurgitation. No evidence of mitral stenosis.   4. The aortic valve is tricuspid. Aortic valve regurgitation is not  visualized. No aortic stenosis is present.   5. The inferior vena cava is normal in size with greater than 50%  respiratory variability, suggesting right atrial pressure of 3 mmHg.     CHA2DS2-VASc Score = 3  The patient's score is based upon: CHF History: 0 HTN History: 1 Diabetes History: 0 Stroke History: 0 Vascular Disease History: 1 Age Score: 1 Gender Score: 0       ASSESSMENT AND PLAN: Paroxysmal Atrial Fibrillation (ICD10:  I48.0) The patient's CHA2DS2-VASc score is 3, indicating a 3.2% annual risk of stroke.   S/p afib ablation 04/06/23 Patient appears to be maintaining SR Continue Eliquis 5 mg BID with no missed doses for 3 months post ablation. Increase diltiazem to 240 mg daily Apple Watch for home monitoring  Secondary Hypercoagulable State (ICD10:  D68.69) The patient is at significant risk for stroke/thromboembolism based upon his CHA2DS2-VASc Score of 3.  Continue Apixaban (Eliquis).   CAD CAC score 271 on CT No anginal symptoms On Repatha   HTN Patient has noticed more elevated readings at home. Will increase diltiazem to 240 mg daily  Snoring/suspected OSA STOP-Bang score of 6 The importance of adequate treatment of sleep apnea was discussed today in order to improve our ability to maintain sinus rhythm long term. Will refer for sleep study.     Follow up with Dr Lalla Brothers as scheduled.      Jorja Loa PA-C Afib Clinic [facility] [address] [place], [place] [address] [phone]

## 2023-05-28 DIAGNOSIS — R0683 Snoring: Secondary | ICD-10-CM | POA: Diagnosis not present

## 2023-06-02 DIAGNOSIS — Z125 Encounter for screening for malignant neoplasm of prostate: Secondary | ICD-10-CM | POA: Diagnosis not present

## 2023-06-02 DIAGNOSIS — E291 Testicular hypofunction: Secondary | ICD-10-CM | POA: Diagnosis not present

## 2023-06-03 DIAGNOSIS — L821 Other seborrheic keratosis: Secondary | ICD-10-CM | POA: Diagnosis not present

## 2023-06-03 DIAGNOSIS — L918 Other hypertrophic disorders of the skin: Secondary | ICD-10-CM | POA: Diagnosis not present

## 2023-06-04 DIAGNOSIS — I482 Chronic atrial fibrillation, unspecified: Secondary | ICD-10-CM | POA: Diagnosis not present

## 2023-06-04 DIAGNOSIS — J309 Allergic rhinitis, unspecified: Secondary | ICD-10-CM | POA: Diagnosis not present

## 2023-06-04 DIAGNOSIS — Z9889 Other specified postprocedural states: Secondary | ICD-10-CM | POA: Diagnosis not present

## 2023-06-04 DIAGNOSIS — J320 Chronic maxillary sinusitis: Secondary | ICD-10-CM | POA: Diagnosis not present

## 2023-06-10 DIAGNOSIS — J320 Chronic maxillary sinusitis: Secondary | ICD-10-CM | POA: Diagnosis not present

## 2023-06-10 DIAGNOSIS — Z9889 Other specified postprocedural states: Secondary | ICD-10-CM | POA: Diagnosis not present

## 2023-06-16 DIAGNOSIS — H2513 Age-related nuclear cataract, bilateral: Secondary | ICD-10-CM | POA: Diagnosis not present

## 2023-06-22 DIAGNOSIS — G4733 Obstructive sleep apnea (adult) (pediatric): Secondary | ICD-10-CM | POA: Diagnosis not present

## 2023-07-03 DIAGNOSIS — G4733 Obstructive sleep apnea (adult) (pediatric): Secondary | ICD-10-CM | POA: Diagnosis not present

## 2023-07-06 DIAGNOSIS — H2511 Age-related nuclear cataract, right eye: Secondary | ICD-10-CM | POA: Diagnosis not present

## 2023-07-10 DIAGNOSIS — G4733 Obstructive sleep apnea (adult) (pediatric): Secondary | ICD-10-CM | POA: Diagnosis not present

## 2023-07-13 DIAGNOSIS — H2512 Age-related nuclear cataract, left eye: Secondary | ICD-10-CM | POA: Diagnosis not present

## 2023-07-15 DIAGNOSIS — Z7982 Long term (current) use of aspirin: Secondary | ICD-10-CM | POA: Diagnosis not present

## 2023-07-15 DIAGNOSIS — Z7901 Long term (current) use of anticoagulants: Secondary | ICD-10-CM | POA: Diagnosis not present

## 2023-07-15 DIAGNOSIS — Z888 Allergy status to other drugs, medicaments and biological substances status: Secondary | ICD-10-CM | POA: Diagnosis not present

## 2023-07-15 DIAGNOSIS — E7800 Pure hypercholesterolemia, unspecified: Secondary | ICD-10-CM | POA: Diagnosis not present

## 2023-07-15 DIAGNOSIS — Z79899 Other long term (current) drug therapy: Secondary | ICD-10-CM | POA: Diagnosis not present

## 2023-07-15 DIAGNOSIS — G4730 Sleep apnea, unspecified: Secondary | ICD-10-CM | POA: Diagnosis not present

## 2023-07-15 DIAGNOSIS — H2511 Age-related nuclear cataract, right eye: Secondary | ICD-10-CM | POA: Diagnosis not present

## 2023-07-15 DIAGNOSIS — I4891 Unspecified atrial fibrillation: Secondary | ICD-10-CM | POA: Diagnosis not present

## 2023-07-15 DIAGNOSIS — I10 Essential (primary) hypertension: Secondary | ICD-10-CM | POA: Diagnosis not present

## 2023-07-15 DIAGNOSIS — Z87891 Personal history of nicotine dependence: Secondary | ICD-10-CM | POA: Diagnosis not present

## 2023-07-15 DIAGNOSIS — Z9989 Dependence on other enabling machines and devices: Secondary | ICD-10-CM | POA: Diagnosis not present

## 2023-07-23 DIAGNOSIS — I10 Essential (primary) hypertension: Secondary | ICD-10-CM | POA: Diagnosis not present

## 2023-07-23 DIAGNOSIS — H2512 Age-related nuclear cataract, left eye: Secondary | ICD-10-CM | POA: Diagnosis not present

## 2023-07-23 DIAGNOSIS — E7800 Pure hypercholesterolemia, unspecified: Secondary | ICD-10-CM | POA: Diagnosis not present

## 2023-07-23 DIAGNOSIS — I4891 Unspecified atrial fibrillation: Secondary | ICD-10-CM | POA: Diagnosis not present

## 2023-07-29 ENCOUNTER — Ambulatory Visit: Payer: BC Managed Care – PPO | Admitting: Cardiology

## 2023-08-09 DIAGNOSIS — G4733 Obstructive sleep apnea (adult) (pediatric): Secondary | ICD-10-CM | POA: Diagnosis not present

## 2023-08-21 DIAGNOSIS — E291 Testicular hypofunction: Secondary | ICD-10-CM | POA: Diagnosis not present

## 2023-08-21 DIAGNOSIS — Z125 Encounter for screening for malignant neoplasm of prostate: Secondary | ICD-10-CM | POA: Diagnosis not present

## 2023-08-25 ENCOUNTER — Other Ambulatory Visit (HOSPITAL_COMMUNITY): Payer: Self-pay | Admitting: Physician Assistant

## 2023-08-28 DIAGNOSIS — E291 Testicular hypofunction: Secondary | ICD-10-CM | POA: Diagnosis not present

## 2023-09-07 DIAGNOSIS — G4733 Obstructive sleep apnea (adult) (pediatric): Secondary | ICD-10-CM | POA: Diagnosis not present

## 2023-09-09 DIAGNOSIS — G4733 Obstructive sleep apnea (adult) (pediatric): Secondary | ICD-10-CM | POA: Diagnosis not present

## 2023-09-17 ENCOUNTER — Ambulatory Visit: Payer: BC Managed Care – PPO | Attending: Student | Admitting: Student

## 2023-09-17 ENCOUNTER — Encounter: Payer: Self-pay | Admitting: Student

## 2023-09-17 VITALS — BP 126/70 | HR 55 | Ht 71.0 in | Wt 245.0 lb

## 2023-09-17 DIAGNOSIS — R931 Abnormal findings on diagnostic imaging of heart and coronary circulation: Secondary | ICD-10-CM

## 2023-09-17 DIAGNOSIS — D6869 Other thrombophilia: Secondary | ICD-10-CM

## 2023-09-17 DIAGNOSIS — I480 Paroxysmal atrial fibrillation: Secondary | ICD-10-CM | POA: Diagnosis not present

## 2023-09-17 DIAGNOSIS — I10 Essential (primary) hypertension: Secondary | ICD-10-CM | POA: Diagnosis not present

## 2023-09-17 NOTE — Progress Notes (Signed)
  Electrophysiology Office Note:   Date:  09/17/2023  ID:  Jeffery Evans, DOB [birth year], MRN [id number]  Primary Cardiologist: Ozell Fell, MD Electrophysiologist: OLE ONEIDA HOLTS, MD      History of Present Illness:   Jeffery Evans is a 68 y.o. male with h/o CAD, HLD, HTN, and PAF seen today for routine electrophysiology followup.   S/p ablation by Dr. Holts 04/06/2023.  Has had one episode of tachycardia post ablation, but otherwise has remained in NSR by watch and symptoms.   Since last being seen in our clinic the patient reports doing well overall. No further tachycardia. Overall,  he denies chest pain, palpitations, dyspnea, PND, orthopnea, nausea, vomiting, dizziness, syncope, edema, weight gain, or early satiety.   Review of systems complete and found to be negative unless listed in HPI.   EP Information / Studies Reviewed:    EKG is not ordered today. EKG from 05/08/2023 reviewed which showed NSR  Assessment by Crist and pts watch also demonstrated NSR  Physical Exam:   VS:  BP 126/70   Pulse (!) 55   Ht 5' 11 (1.803 m)   Wt 245 lb (111.1 kg)   SpO2 95%   BMI 34.17 kg/m    Wt Readings from Last 3 Encounters:  09/17/23 245 lb (111.1 kg)  05/08/23 234 lb 9.6 oz (106.4 kg)  04/06/23 234 lb (106.1 kg)     GEN: No acute distress NECK: No JVD; No carotid bruits CARDIAC: Regular rate and rhythm, no murmurs, rubs, gallops RESPIRATORY:  Clear to auscultation without rales, wheezing or rhonchi  ABDOMEN: Soft, non-tender, non-distended EXTREMITIES:  No edema; No deformity   ASSESSMENT AND PLAN:    Paroxysmal Atrial fibrillation S/p ablation 04/06/2023 Maintaining NSR Continue eliquis  5 mg BID for CHA2DS2VASc of at least 3  Continue diltiazem  240 mg daily.  Continue apple watch for home monitoring.   Secondary hypercoagulable state Pt on Eliquis  as above  With score > 2, not sure we could confidently come off of OAC without long term monitoring, thus will set  up for 6 month follow up to discuss loop and watchful waiting vs continuing OAC lifelong.   CAD CAC score 271 on CT Continue Repatha  Denies s/s ischemia  HTN Stable on current regimen   OSA  Continue CPAP s/p sleep study 06/20/2023   Follow up with Dr. Holts in 6 months  Signed, Ozell Prentice Passey, PA-C

## 2023-09-17 NOTE — Patient Instructions (Signed)
 Medication Instructions:  Your physician recommends that you continue on your current medications as directed. Please refer to the Current Medication list given to you today.  *If you need a refill on your cardiac medications before your next appointment, please call your pharmacy*  Lab Work: None ordered If you have labs (blood work) drawn today and your tests are completely normal, you will receive your results only by: MyChart Message (if you have MyChart) OR A paper copy in the mail If you have any lab test that is abnormal or we need to change your treatment, we will call you to review the results.  Follow-Up: At [facility], you and your health needs are our priority.  As part of our continuing mission to provide you with exceptional heart care, we have created designated Provider Care Teams.  These Care Teams include your primary Cardiologist (physician) and Advanced Practice Providers (APPs -  Physician Assistants and Nurse Practitioners) who all work together to provide you with the care you need, when you need it.  Your next appointment:   6 month(s)  Provider:   Ole Holts, MD to discuss possible loop implant.

## 2023-10-10 DIAGNOSIS — G4733 Obstructive sleep apnea (adult) (pediatric): Secondary | ICD-10-CM | POA: Diagnosis not present

## 2023-10-15 DIAGNOSIS — G4733 Obstructive sleep apnea (adult) (pediatric): Secondary | ICD-10-CM | POA: Diagnosis not present

## 2023-10-29 ENCOUNTER — Other Ambulatory Visit: Payer: Self-pay | Admitting: Cardiology

## 2023-11-03 DIAGNOSIS — G4733 Obstructive sleep apnea (adult) (pediatric): Secondary | ICD-10-CM | POA: Diagnosis not present

## 2023-11-19 ENCOUNTER — Other Ambulatory Visit (HOSPITAL_COMMUNITY): Payer: Self-pay | Admitting: Physician Assistant

## 2023-11-23 DIAGNOSIS — H3562 Retinal hemorrhage, left eye: Secondary | ICD-10-CM | POA: Diagnosis not present

## 2023-11-23 DIAGNOSIS — H43393 Other vitreous opacities, bilateral: Secondary | ICD-10-CM | POA: Diagnosis not present

## 2023-11-23 DIAGNOSIS — H43812 Vitreous degeneration, left eye: Secondary | ICD-10-CM | POA: Diagnosis not present

## 2023-11-23 DIAGNOSIS — H43821 Vitreomacular adhesion, right eye: Secondary | ICD-10-CM | POA: Diagnosis not present

## 2023-12-19 ENCOUNTER — Other Ambulatory Visit: Payer: Self-pay | Admitting: Cardiovascular Disease

## 2023-12-21 DIAGNOSIS — H43812 Vitreous degeneration, left eye: Secondary | ICD-10-CM | POA: Diagnosis not present

## 2023-12-21 DIAGNOSIS — H43393 Other vitreous opacities, bilateral: Secondary | ICD-10-CM | POA: Diagnosis not present

## 2023-12-21 DIAGNOSIS — H43821 Vitreomacular adhesion, right eye: Secondary | ICD-10-CM | POA: Diagnosis not present

## 2023-12-21 DIAGNOSIS — D3131 Benign neoplasm of right choroid: Secondary | ICD-10-CM | POA: Diagnosis not present

## 2023-12-21 NOTE — Telephone Encounter (Signed)
 Prescription refill request for Eliquis received. Indication:afib Last office visit:1/25 Scr:1.01  7/24 Age: 68 Weight:111.1  kg  Prescription refilled

## 2024-01-19 DIAGNOSIS — E291 Testicular hypofunction: Secondary | ICD-10-CM | POA: Diagnosis not present

## 2024-01-19 DIAGNOSIS — Z125 Encounter for screening for malignant neoplasm of prostate: Secondary | ICD-10-CM | POA: Diagnosis not present

## 2024-01-22 DIAGNOSIS — E291 Testicular hypofunction: Secondary | ICD-10-CM | POA: Diagnosis not present

## 2024-06-06 DIAGNOSIS — L570 Actinic keratosis: Secondary | ICD-10-CM | POA: Diagnosis not present

## 2024-06-06 DIAGNOSIS — L821 Other seborrheic keratosis: Secondary | ICD-10-CM | POA: Diagnosis not present

## 2024-07-06 NOTE — Progress Notes (Unsigned)
  Electrophysiology Office Follow up Visit Note:    Date:  07/07/2024   ID:  Jeffery Evans, DOB Dec 30, 1955, MRN [id number]  PCP:  Janey Santos, MD  [facility] Cardiologist:  Ozell Fell, MD  [facility] Electrophysiologist:  OLE ONEIDA HOLTS, MD    Interval History:     Jeffery Evans is a 68 y.o. male who presents for a follow up visit.   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  He had an ablation April 06, 2023.  He monitors his heart rhythm using an Apple watch and has not had a sustained recurrence of arrhythmia.  He takes Eliquis  for stroke prophylaxis.  He is doing quite well.  No known recurrence of A-fib since the ablation.  He continues to take Eliquis .      Past medical, surgical, social and family history were reviewed.  ROS:   Please see the history of present illness.    All other systems reviewed and are negative.  EKGs/Labs/Other Studies Reviewed:    The following studies were reviewed today:          Physical Exam:    VS:  BP 130/70 (BP Location: Left Arm, Patient Position: Sitting, Cuff Size: Large)   Pulse 72   Ht 5' 11 (1.803 m)   Wt 225 lb (102.1 kg)   SpO2 96%   BMI 31.38 kg/m     Wt Readings from Last 3 Encounters:  07/07/24 225 lb (102.1 kg)  09/17/23 245 lb (111.1 kg)  05/08/23 234 lb 9.6 oz (106.4 kg)     GEN: no distress CARD: RRR, No MRG RESP: No IWOB. CTAB.      ASSESSMENT:    1. PAF (paroxysmal atrial fibrillation) (HCC)   2. Primary hypertension    PLAN:    In order of problems listed above:  #Paroxysmal atrial fibrillation Doing well after his catheter ablation in July 2024.  On Eliquis  for stroke prophylaxis in addition to diltiazem .  We a long discussion today about the data supporting anticoagulation after a seemingly successful A-fib ablation.  After our discussion and given his family history of significant cardioembolic stroke, the patient has elected to continue anticoagulation  with Eliquis  which I think is very reasonable.  #Hypertension At goal today.  Recommend checking blood pressures 1-2 times per week at home and recording the values.  Recommend bringing these recordings to the primary care physician.  I discussed my upcoming departure from [facility] during today's clinic appointment.  He will continue to follow-up with one of my EP partners moving forward.  Follow-up 1 year with EP MD.    Signed, Ole Holts, MD, [facility], [facility] 07/07/2024 9:51 AM    Electrophysiology [facility]

## 2024-07-07 ENCOUNTER — Ambulatory Visit: Attending: Internal Medicine | Admitting: Cardiology

## 2024-07-07 ENCOUNTER — Encounter: Payer: Self-pay | Admitting: Cardiology

## 2024-07-07 VITALS — BP 130/70 | HR 72 | Ht 71.0 in | Wt 225.0 lb

## 2024-07-07 DIAGNOSIS — I10 Essential (primary) hypertension: Secondary | ICD-10-CM

## 2024-07-07 DIAGNOSIS — I480 Paroxysmal atrial fibrillation: Secondary | ICD-10-CM | POA: Diagnosis not present

## 2024-07-07 NOTE — Patient Instructions (Signed)

## 2024-07-20 DIAGNOSIS — E291 Testicular hypofunction: Secondary | ICD-10-CM | POA: Diagnosis not present

## 2024-07-28 DIAGNOSIS — E291 Testicular hypofunction: Secondary | ICD-10-CM | POA: Diagnosis not present

## 2024-08-11 ENCOUNTER — Encounter: Payer: Self-pay | Admitting: Gastroenterology

## 2024-08-11 ENCOUNTER — Other Ambulatory Visit: Payer: Self-pay

## 2024-08-11 DIAGNOSIS — R7689 Other specified abnormal immunological findings in serum: Secondary | ICD-10-CM

## 2024-08-11 NOTE — Progress Notes (Signed)
Lab orders have been entered as ordered

## 2024-08-11 NOTE — Progress Notes (Signed)
 Pateint recently presented to donate blood. 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 We need to see if this is a False Positive or if this is a True positive, if he has any viral load.  I will have patient come in for a CBC/CMP/INR/Hepatitis C antibody/Hepatitis C RNA level/ Hepatitis A antibody total/Hepatitis A IgM antibody/Hepatitis B surface antibody/Hepatitis B surface antigen/Hepatitis B core antibody total/Hepatitis B core antibody IgM  He will come in for labs within the next few days to the [place] office.  Will forward to my team to work on placing these.  Aloha Finner, MD [facility] Advanced Endoscopy Office # [phone]

## 2024-08-12 ENCOUNTER — Other Ambulatory Visit: Payer: Self-pay

## 2024-08-12 ENCOUNTER — Ambulatory Visit

## 2024-08-12 ENCOUNTER — Other Ambulatory Visit (INDEPENDENT_AMBULATORY_CARE_PROVIDER_SITE_OTHER)

## 2024-08-12 DIAGNOSIS — R7689 Other specified abnormal immunological findings in serum: Secondary | ICD-10-CM | POA: Diagnosis not present

## 2024-08-12 LAB — CBC WITH DIFFERENTIAL/PLATELET
Basophils Absolute: 0 K/uL (ref 0.0–0.1)
Basophils Relative: 0.4 % (ref 0.0–3.0)
Eosinophils Absolute: 0.1 K/uL (ref 0.0–0.7)
Eosinophils Relative: 0.8 % (ref 0.0–5.0)
HCT: 49.1 % (ref 39.0–52.0)
Hemoglobin: 16.7 g/dL (ref 13.0–17.0)
Lymphocytes Relative: 21.9 % (ref 12.0–46.0)
Lymphs Abs: 1.7 K/uL (ref 0.7–4.0)
MCHC: 34 g/dL (ref 30.0–36.0)
MCV: 90.7 fl (ref 78.0–100.0)
Monocytes Absolute: 0.5 K/uL (ref 0.1–1.0)
Monocytes Relative: 6.1 % (ref 3.0–12.0)
Neutro Abs: 5.6 K/uL (ref 1.4–7.7)
Neutrophils Relative %: 70.8 % (ref 43.0–77.0)
Platelets: 248 K/uL (ref 150.0–400.0)
RBC: 5.42 Mil/uL (ref 4.22–5.81)
RDW: 13.6 % (ref 11.5–15.5)
WBC: 7.9 K/uL (ref 4.0–10.5)

## 2024-08-12 LAB — COMPREHENSIVE METABOLIC PANEL WITH GFR
ALT: 20 U/L (ref 0–53)
AST: 18 U/L (ref 0–37)
Albumin: 4.5 g/dL (ref 3.5–5.2)
Alkaline Phosphatase: 48 U/L (ref 39–117)
BUN: 10 mg/dL (ref 6–23)
CO2: 29 meq/L (ref 19–32)
Calcium: 9.1 mg/dL (ref 8.4–10.5)
Chloride: 103 meq/L (ref 96–112)
Creatinine, Ser: 0.96 mg/dL (ref 0.40–1.50)
GFR: 81.36 mL/min (ref >60.00)
Glucose, Bld: 125 mg/dL — ABNORMAL HIGH (ref 70–99)
Potassium: 3.8 meq/L (ref 3.5–5.1)
Sodium: 139 meq/L (ref 135–145)
Total Bilirubin: 1 mg/dL (ref 0.2–1.2)
Total Protein: 6.8 g/dL (ref 6.0–8.3)

## 2024-08-12 LAB — PROTIME-INR
INR: 1.3 ratio — ABNORMAL HIGH (ref 0.8–1.0)
Prothrombin Time: 14.1 s — ABNORMAL HIGH (ref 9.6–13.1)

## 2024-08-13 LAB — HEPATITIS B CORE ANTIBODY, TOTAL: Hep B Core Total Ab: NONREACTIVE

## 2024-08-13 LAB — HEPATITIS A ANTIBODY, IGM: Hep A IgM: NONREACTIVE

## 2024-08-13 LAB — HEPATITIS B SURFACE ANTIBODY,QUALITATIVE: Hep B S Ab: NONREACTIVE

## 2024-08-13 LAB — HEPATITIS B SURFACE ANTIGEN: Hepatitis B Surface Ag: NONREACTIVE

## 2024-08-13 LAB — HEPATITIS C ANTIBODY: Hepatitis C Ab: NONREACTIVE

## 2024-08-13 LAB — HEPATITIS A ANTIBODY, TOTAL: Hepatitis A AB,Total: NONREACTIVE

## 2024-08-13 LAB — HEPATITIS B CORE ANTIBODY, IGM: Hep B C IgM: NONREACTIVE

## 2024-08-14 ENCOUNTER — Ambulatory Visit: Payer: Self-pay | Admitting: Gastroenterology

## 2024-08-14 DIAGNOSIS — R7689 Other specified abnormal immunological findings in serum: Secondary | ICD-10-CM

## 2024-08-14 LAB — HEPATITIS C RNA QUANTITATIVE
HCV Quantitative Log: <1.18 {Log_IU}/mL
HCV RNA, PCR, QN: <15 [IU]/mL

## 2024-08-15 ENCOUNTER — Ambulatory Visit: Payer: Self-pay | Admitting: Gastroenterology

## 2024-08-15 NOTE — Addendum Note (Signed)
 Addended by: ANITRA ODETTA CROME on: 08/15/2024 11:18 AM   Modules accepted: Orders

## 2024-08-15 NOTE — Telephone Encounter (Signed)
Thanks to all. GM

## 2024-08-22 ENCOUNTER — Ambulatory Visit

## 2024-08-22 DIAGNOSIS — Z23 Encounter for immunization: Secondary | ICD-10-CM

## 2024-09-26 ENCOUNTER — Ambulatory Visit

## 2024-09-26 DIAGNOSIS — Z23 Encounter for immunization: Secondary | ICD-10-CM | POA: Diagnosis not present

## 2025-02-22 ENCOUNTER — Ambulatory Visit
